# Patient Record
Sex: Female | Born: 1965
Health system: Southern US, Community
[De-identification: ages and names within clinical notes are randomized; demographics above are authoritative.]

## PROBLEM LIST (undated history)

## (undated) DIAGNOSIS — K219 Gastro-esophageal reflux disease without esophagitis: Secondary | ICD-10-CM

## (undated) DIAGNOSIS — J40 Bronchitis, not specified as acute or chronic: Secondary | ICD-10-CM

## (undated) DIAGNOSIS — M549 Dorsalgia, unspecified: Secondary | ICD-10-CM

## (undated) DIAGNOSIS — G709 Myoneural disorder, unspecified: Secondary | ICD-10-CM

## (undated) DIAGNOSIS — Z720 Tobacco use: Secondary | ICD-10-CM

## (undated) DIAGNOSIS — F32A Depression, unspecified: Secondary | ICD-10-CM

## (undated) DIAGNOSIS — F329 Major depressive disorder, single episode, unspecified: Secondary | ICD-10-CM

## (undated) DIAGNOSIS — F419 Anxiety disorder, unspecified: Secondary | ICD-10-CM

## (undated) HISTORY — DX: Myoneural disorder, unspecified: G70.9

## (undated) HISTORY — DX: Gastro-esophageal reflux disease without esophagitis: K21.9

## (undated) HISTORY — DX: Major depressive disorder, single episode, unspecified: F32.9

## (undated) HISTORY — DX: Dorsalgia, unspecified: M54.9

## (undated) HISTORY — DX: Depression, unspecified: F32.A

## (undated) HISTORY — DX: Tobacco use: Z72.0

## (undated) HISTORY — DX: Anxiety disorder, unspecified: F41.9

## (undated) HISTORY — DX: Bronchitis, not specified as acute or chronic: J40

---

## 1988-10-31 HISTORY — PX: TUBAL LIGATION: SHX77

## 2016-12-07 NOTE — Congregational Nurse Program (Signed)
Congregational Nurse Program Note  Date of Encounter: 12/06/2016  Past Medical History: No past medical history on file.  Encounter Details:     CNP Questionnaire - 12/06/16 1330      Patient Demographics   Is this a new or existing patient? New   Patient is considered a/an Not Applicable   Race Caucasian/White     Patient Assistance   Location of Patient Assistance [place]   Patient's financial/insurance status Low Income;Self-Pay (Uninsured)   Uninsured Patient ([place]ange Card/Care Connects) Yes   Interventions Not Applicable   Patient referred to apply for the following financial assistance Not Applicable   Food insecurities addressed Referred to food bank or resource   Transportation assistance No   Assistance securing medications No   Educational health offerings Other;Navigating the healthcare system     Encounter Details   Primary purpose of visit Education/Health Concerns;Other;Navigating the Healthcare System   Was an Emergency Department visit averted? Not Applicable   Does patient have a medical provider? No   Patient referred to Establish PCP;Private Practice;Other   Was a mental health screening completed? (GAINS tool) Yes   Was a mental health referral made? No   Does patient have dental issues? No   Does patient have vision issues? No   Does your patient have an abnormal blood pressure today? No   Since previous encounter, have you referred patient for abnormal blood pressure that resulted in a new diagnosis or medication change? No   Does your patient have an abnormal blood glucose today? No   Since previous encounter, have you referred patient for abnormal blood glucose that resulted in a new diagnosis or medication change? No   Was there a life-saving intervention made? No      [facility] who had called stating she needed a primary care provider for a physical so she could begin work at [employer].  Client arrived vital signs  taken and reviewed. Client presents paperwork that she has been evaluated by the [employer] [facility] [employer] employee nurse and due to an existing history of carpal tunnel without corrective surgery and prior back issues, she needs a medical provider to state she is cleared to do the type of job she would be hired for.  RN contacted the [facility] and they are unable to evaluate clients for fitness for work . Client states she has already contacted the Urgent Care and [place] and was told they could not help her. Client states she wants this complete so she can begin work Thursday 12/08/16 this week. Client states [place] moved from [place] here and needs to work. She states she has borrowed some money to pay if she can find someone to evaluate her. RN referred client to [facility] [employer] BSW intern for case management to call area providers for any possible providers that would be willing to evaluate her. One provider was found and contact information given to client to follow through.  RN also called [employer] health employee nurse for clarification on what was needed. Employee nurse states that with past history of back carpal tunnel issues and the nature of the work she would be doing, it is required that the person seeking employment find their own medical provider to issue a letter stating if she can or cannot be fit for the work stated and requirements stated in information given to client.  RN will follow up with client next week to see if she had gotten approval for work.

## 2016-12-12 ENCOUNTER — Encounter: Payer: Self-pay | Admitting: Family Medicine

## 2016-12-12 ENCOUNTER — Ambulatory Visit (INDEPENDENT_AMBULATORY_CARE_PROVIDER_SITE_OTHER): Payer: Self-pay | Admitting: Family Medicine

## 2016-12-12 VITALS — BP 102/56 | HR 76 | Temp 98.9°F | Resp 18 | Ht 65.0 in | Wt 143.0 lb

## 2016-12-12 DIAGNOSIS — Z720 Tobacco use: Secondary | ICD-10-CM

## 2016-12-12 DIAGNOSIS — Z716 Tobacco abuse counseling: Secondary | ICD-10-CM | POA: Insufficient documentation

## 2016-12-12 DIAGNOSIS — Z7689 Persons encountering health services in other specified circumstances: Secondary | ICD-10-CM

## 2016-12-12 DIAGNOSIS — B182 Chronic viral hepatitis C: Secondary | ICD-10-CM

## 2016-12-12 DIAGNOSIS — Z23 Encounter for immunization: Secondary | ICD-10-CM

## 2016-12-12 DIAGNOSIS — G5600 Carpal tunnel syndrome, unspecified upper limb: Secondary | ICD-10-CM | POA: Insufficient documentation

## 2016-12-12 DIAGNOSIS — G5603 Carpal tunnel syndrome, bilateral upper limbs: Secondary | ICD-10-CM

## 2016-12-12 HISTORY — DX: Tobacco use: Z72.0

## 2016-12-12 NOTE — Progress Notes (Signed)
Chief Complaint  Patient presents with  . Establish Care   Patient is new to establish. She would like a letter sent to a prospective employer regarding her ability to work safely. She went to apply for a job and was told she needed a letter from her PCP before they could hire her. She has bilateral carpal tunnel syndrome. She's had this for many years. At her prior job after working there for a short time she developed symptomatic carpal tunnel, and discontinued work. She was not successful in establishing a Worker's Comp. claim. She now wishes to work for a new company, and concern exists regarding her ability to use her upper extremities. She states she is currently asymptomatic. She has not had any carpal tunnel syndrome since leaving her prior repetitive job. She states that she is otherwise in good health. She has hepatitis C. She thinks it's from an old tattoo. She denies ever using IV drugs. She is a tobacco smoker.I have discussed the multiple health risks associated with cigarette smoking including, but not limited to, cardiovascular disease, lung disease and cancer.  I have strongly recommended that smoking be stopped.  I have reviewed the various methods of quitting including cold Mal[place]urkey, classes, nicotine replacements and prescription medications.  I have offered assistance in this difficult process.  The patient is not interested in assistance at this time.    Patient Active Problem List   Diagnosis Date Noted  . CTS (carpal tunnel syndrome) 12/12/2016  . Tobacco abuse 12/12/2016  . Hepatitis C 12/12/2016    No outpatient encounter prescriptions on file as of 12/12/2016.   No facility-administered encounter medications on file as of 12/12/2016.     Past Medical History:  Diagnosis Date  . Anxiety   . Depression   . Neuromuscular disorder (HCC)    bilat carpal tunnel synd  . Tobacco abuse 12/12/2016    Past Surgical History:  Procedure Laterality Date  . TUBAL  LIGATION  1990    Social History   Social History  . Marital status: Single    Spouse name: N/A  . Number of children: 1  . Years of education: 812   Occupational History  . Not on file.   Social History Main Topics  . Smoking status: Current Every Day Smoker    Packs/day: 0.50    Types: Cigarettes    Start date: 10/31/1980  . Smokeless tobacco: Never Used     Comment: discussed  . Alcohol use No  . Drug use: No  . Sexual activity: Not Currently   Other Topics Concern  . Not on file   Social History Narrative   Lives alone   Micah FlesherSon Aaron lives near       Family History  Problem Relation Age of Onset  . Heart disease Mother   . Hyperlipidemia Mother   . Hypertension Mother   . Stroke Mother   . Cancer Father     pancrease  . Diabetes Father   . Hearing loss Father   . Cancer Sister     Corky Mullovairan  . Hepatitis C Sister   . Drug abuse Sister   . Cancer Paternal Grandmother     pancreatic    Review of Systems  Constitutional: Negative for chills, fever and weight loss.  HENT: Negative for congestion and hearing loss.   Eyes: Negative for blurred vision and pain.  Respiratory: Negative for cough and shortness of breath.   Cardiovascular: Negative for chest pain and leg  swelling.  Gastrointestinal: Negative for abdominal pain, constipation, diarrhea and heartburn.  Genitourinary: Negative for dysuria and frequency.  Musculoskeletal: Negative for falls, joint pain and myalgias.  Neurological: Negative for dizziness, seizures and headaches.  Psychiatric/Behavioral: Negative for depression. The patient is not nervous/anxious and does not have insomnia.     BP (!) 102/56 (BP Location: Right Arm, Patient Position: Sitting, Cuff Size: Normal)   Pulse 76   Temp 98.9 F (37.2 C) (Temporal)   Resp 18   Ht 5\' 5"  (1.651 m)   Wt 143 lb (64.9 kg)   LMP 12/12/2010 (Approximate)   SpO2 99%   BMI 23.80 kg/m   Physical Exam  Constitutional: She is oriented to person,  place, and time. She appears well-developed and well-nourished.  Smells of tobacco  HENT:  Head: Normocephalic and atraumatic.  Right Ear: External ear normal.  Left Ear: External ear normal.  Mouth/Throat: Oropharynx is clear and moist.  Eyes: Conjunctivae are normal. Pupils are equal, round, and reactive to light.  Neck: Normal range of motion. Neck supple. No thyromegaly present.  Cardiovascular: Normal rate, regular rhythm and normal heart sounds.   Pulmonary/Chest: Effort normal and breath sounds normal. No respiratory distress.  Abdominal: Soft. Bowel sounds are normal.  Musculoskeletal: Normal range of motion. She exhibits no edema.  Phalen's and Tinel's testing is negative  Lymphadenopathy:    She has no cervical adenopathy.  Neurological: She is alert and oriented to person, place, and time.  Gait normal  Skin: Skin is warm and dry.  Psychiatric: She has a normal mood and affect. Her behavior is normal. Thought content normal.  Nursing note and vitals reviewed.  ASSESSMENT/PLAN:  1. Encounter to establish care with new doctor  2. Need for influenza vaccination - Flu Vaccine QUAD 36+ mos IM  3. Bilateral carpal tunnel syndrome I discussed with the patient that I will be happy to contact her company with a letter. I told her that I could clear her to do any job except for one that was deemed repetitive with upper extremities. I told her that with a job that required heavy or repetitive use of her hands she would likely recurrence of her carpal tunnel syndrome symptoms.  4. Tobacco abuse Advised to quit   Patient Instructions  Try to cut down or quit smoking  Need old records from PCP/GYN provider  I will contact your company to send them a note about working safely  Need yearly medical visits  Call sooner for problems     Eustace Moore, MD

## 2016-12-12 NOTE — Patient Instructions (Signed)
Try to cut down or quit smoking  Need old records from PCP/GYN provider  I will contact your company to send them a note about working safely  Need yearly medical visits  Call sooner for problems

## 2016-12-14 ENCOUNTER — Ambulatory Visit (INDEPENDENT_AMBULATORY_CARE_PROVIDER_SITE_OTHER): Payer: Self-pay | Admitting: Internal Medicine

## 2016-12-14 ENCOUNTER — Encounter: Payer: Self-pay | Admitting: Internal Medicine

## 2016-12-14 ENCOUNTER — Encounter: Payer: Self-pay | Admitting: Pharmacist

## 2016-12-14 VITALS — BP 102/69 | HR 92 | Temp 98.2°F | Wt 141.0 lb

## 2016-12-14 DIAGNOSIS — B182 Chronic viral hepatitis C: Secondary | ICD-10-CM

## 2016-12-14 MED ORDER — SOFOSBUVIR-VELPATASVIR 400-100 MG PO TABS: 1.0000 | ORAL_TABLET | Freq: Every day | ORAL | 2 refills | Status: DC

## 2016-12-14 NOTE — Patient Instructions (Signed)
Date 12/14/16  Dear Ms Whitney Powell, As discussed in the ID Clinic, your hepatitis C therapy will include the following medications:    sofosbuvir 400 mg/velpatasvir 100 mg (Epclusa) oral daily  Please note that ALL MEDICATIONS WILL START ON THE SAME DATE for a total of 12 weeks. ---------------------------------------------------------------- Your HCV Treatment Start Date: TBA   Your HCV genotype: 1a    Liver Fibrosis: no cirrhosis  ---------------------------------------------------------------- YOUR PHARMACY CONTACT (depending on your insurance):   [facility] [address] [place], [place] [address] Phone: [phone] Hours: Monday to Friday 7:30 am to 6:00 pm   Please always contact your pharmacy at least 3-4 business days before you run out of medications to ensure your next month's medication is ready or 1 week prior to running out if you receive it by mail.  Remember, each prescription is for 28 days. ---------------------------------------------------------------- GENERAL NOTES REGARDING YOUR HEPATITIS C MEDICATION:  SOFOSBUVIR (SOVALDI or EPCLUSA): - Sofosbuvir 400 mg tablet is taken daily with OR without food. - The sofosbuvir tablets are yellow. - The Epclusa tablets are pink, diamond-shaped - The tablets should be stored at room temperature. - The most common side effects with sofosbuvir or Epclusa include:      1. Fatigue      2. Headache      3. Nausea      4. Diarrhea      5. Insomnia  - Acid reducing agents such as H2 blockers (ie. Pepcid (famotidine), Zantac (ranitidine), Tagamet (cimetidine), Axid (nizatidine) and proton pump inhibitors (ie. Prilosec (omeprazole), Protonix (pantoprazole), Nexium (esomeprazole), or Aciphex (rabeprazole)) can decrease effectiveness of Harvoni. Do not take until you have discussed with a health care provider.    -Antacids that contain magnesium and/or aluminum hydroxide (ie. Milk of Magensia, Rolaids, Gaviscon,  Maalox, Mylanta, an dArthritis Pain Formula)can reduce absorption of sofosbuvir, so take them at least 4 hours before or after Harvoni.  -Calcium carbonate (calcium supplements or antacids such as Tums, Caltrate, Os-Cal)needs to be taken at least 4 hours hours before or after sofosbuvir.  -St. John's wort or any products that contain St. John's wort like some herbal supplements  Please inform the office prior to starting any of these medications.   Please note that this only lists the most common side effects and is NOT a comprehensive list of the potential side effects of these medications. For more information, please review the drug information sheets that come with your medication package from the pharmacy.  ---------------------------------------------------------------- GENERAL HELPFUL HINTS ON HCV THERAPY: 1. No alcohol. 2. Stay well-hydrated 3. Notify the ID Clinic of any changes in your other over-the-counter/herbal or prescription medications. 4. If you miss a dose of your medication, take the missed dose as soon as you remember. Return to your regular time/dose schedule the next day.  5.  Do not stop taking your medications without first talking with your healthcare provider. 6.  You may take Tylenol (acetaminophen), as long as the dose is less than 2000 mg (OR no more than 4 tablets of the Tylenol Extra Strengths 500mg  tablet) in 24 hours. 7. You will follow up with our clinic pharmacist initially after starting the medication to monitor for any possible side effects 8. You will get labs once during treatment, soon after treatment completion and again 6 months or more after treatment completion to verify the virus is completely gone.   Staci RighterOMER, Kimala Horne, MD  [facility] [address]  [place], [place]  [address] [phone]

## 2016-12-14 NOTE — Progress Notes (Signed)
[facility]   CC: consideration for treatment for chronic hepatitis C  HPI:  +Whitney Powell is a 51 y.o. female who presents for initial evaluation and management of chronic hepatitis C.  Patient tested positive last year in [place]. Hepatitis C-associated risk factors present are: history of tattoo 6 years ago.  Was previously tested negative. Patient denies intranasal drug use, IV drug abuse, sexual contact with person with liver disease. Patient has had other studies performed. Results: hepatitis C RNA by PCR, result: positive. Patient has not had prior treatment for Hepatitis C. Patient does not have a past history of liver disease. Patient does not have a family history of liver disease. Patient does not  have associated signs or symptoms related to liver disease.  Records from [place] including labs reviewed and confirm chronic hepatitis C with a positive viral load with recent Fibrosure F0/1 and ultrasound without cirrhosis.      Patient does not have documented immunity to Hepatitis A. Patient does not have documented immunity to Hepatitis B.    Review of Systems:   Constitutional: negative for fatigue and malaise Gastrointestinal: negative for diarrhea Integument/breast: negative for rash Musculoskeletal: negative for myalgias and arthralgias All other systems reviewed and are negative       Past Medical History:  Diagnosis Date  . Anxiety   . Depression   . Neuromuscular disorder (HCC)    bilat carpal tunnel synd  . Tobacco abuse 12/12/2016    Prior to Admission medications   Medication Sig Start Date End Date Taking? Authorizing Provider  Sofosbuvir-Velpatasvir (EPCLUSA) 400-100 MG TABS Take 1 tablet by mouth daily. 12/14/16   Gardiner Barefootobert W Comer, MD    No Known Allergies  Social History  Substance Use Topics  . Smoking status: Current Every Day Smoker    Packs/day: 0.50    Types: Cigarettes    Start date: 10/31/1980  . Smokeless tobacco: Never Used     Comment: discussed  . Alcohol use No    Family History  Problem Relation Age of Onset  . Heart disease Mother   . Hyperlipidemia Mother   . Hypertension Mother   . Stroke Mother   . Cancer Father     pancrease  . Diabetes Father   . Hearing loss Father   . Cancer Sister     Corky Mullovairan  . Hepatitis C Sister   . Drug abuse Sister   . Cancer Paternal Grandmother     pancreatic  cirrhosis in sister   Objective:  Constitutional: in no apparent distress,  Vitals:   12/14/16 0933  BP: 102/69  Pulse: 92  Temp: 98.2 F (36.8 C)   Eyes: anicteric Cardiovascular: Cor RRR Respiratory: CTA B; normal respiratory effort Gastrointestinal: Bowel sounds are normal, liver is not enlarged, spleen is not enlarged Musculoskeletal: no pedal edema noted Skin: negatives: no rash; no porphyria cutanea tarda Lymphatic: no cervical lymphadenopathy   Laboratory Genotype: No results found for: HCVGENOTYPE HCV viral load: No results found for: HCVQUANT No results found for: WBC, HGB, HCT, MCV, PLT No results found for: CREATININE, BUN, NA, K, CL, CO2 No results found for: ALT, AST, GGT, ALKPHOS   Labs and history reviewed and show CHILD-PUGH A  5-6 points: Child class A 7-9 points: Child class B 10-15 points: Child class C  No results found for: INR, BILITOT, ALBUMIN   Assessment: New Patient with Chronic Hepatitis C genotype 1a, untreated.  I discussed with the patient the lab  findings that confirm chronic hepatitis C as well as the natural history and progression of disease including about 30% of people who develop cirrhosis of the liver if left untreated and once cirrhosis is established there is a 2-7% risk per year of liver cancer and liver failure.  I discussed the importance of treatment and benefits in reducing the risk, even if significant liver fibrosis exists.   Plan: 1) Patient counseled extensively on limiting acetaminophen to no more than 2 grams daily, avoidance of  alcohol. 2) Transmission discussed with patient including sexual transmission, sharing razors and toothbrush.   3) Will need referral to gastroenterology if concern for cirrhosis 4) Will need referral for substance abuse counseling: No.; Further work up to include urine drug screen  No. 5) Will prescribe Epclusa for 12 weeks via [facility].   6) Hepatitis A vaccine No. 7) Hepatitis B vaccine No. 8) Pneumovax vaccine when she has coverage 9) No elastography indicated with low Fibrosure score and normal ultrasound 10) will follow up after starting medication  All labs from [place] and to be scanned in system  With no medical coverage, she requests limiting labs so will check a CMP at about 4 weeks after starting treatment then an HCV viral load after treatment and at 6 months after treatment completion (SVR 24). Will hold on vaccines until she has coverage

## 2016-12-14 NOTE — Progress Notes (Signed)
Hep C genotype and fibrosure labs scanned under media section.  Will order Hep C viral load today.

## 2016-12-21 LAB — HEPATITIS C RNA QUANTITATIVE
HCV Quantitative Log: 5.83 Log IU/mL — ABNORMAL HIGH
HCV Quantitative: 673000 IU/mL — ABNORMAL HIGH

## 2017-01-02 ENCOUNTER — Encounter: Payer: Self-pay | Admitting: Family Medicine

## 2017-01-02 DIAGNOSIS — F988 Other specified behavioral and emotional disorders with onset usually occurring in childhood and adolescence: Secondary | ICD-10-CM | POA: Insufficient documentation

## 2017-01-02 DIAGNOSIS — F329 Major depressive disorder, single episode, unspecified: Secondary | ICD-10-CM | POA: Insufficient documentation

## 2017-01-02 DIAGNOSIS — F1010 Alcohol abuse, uncomplicated: Secondary | ICD-10-CM | POA: Insufficient documentation

## 2017-01-02 DIAGNOSIS — F419 Anxiety disorder, unspecified: Secondary | ICD-10-CM

## 2017-01-23 ENCOUNTER — Ambulatory Visit: Payer: Self-pay

## 2017-01-24 ENCOUNTER — Ambulatory Visit (INDEPENDENT_AMBULATORY_CARE_PROVIDER_SITE_OTHER): Payer: Self-pay | Admitting: Pharmacist

## 2017-01-24 DIAGNOSIS — B182 Chronic viral hepatitis C: Secondary | ICD-10-CM

## 2017-01-24 NOTE — Progress Notes (Signed)
HPI: Whitney Powell is a 51 y.o. female who presents to the [facility] pharmacy clinic for follow-up of her Hep C infection. She has genotype 1a, F0/F1, and started Epclusa on 01/05/17.  No results found for: HCVGENOTYPE, HEPCGENOTYPE  Allergies: No Known Allergies  Past Medical History: Past Medical History:  Diagnosis Date  . Anxiety   . Depression   . Neuromuscular disorder (HCC)    bilat carpal tunnel synd  . Tobacco abuse 12/12/2016    Social History: Social History   Social History  . Marital status: Single    Spouse name: N/A  . Number of children: 1  . Years of education: 7012   Social History Main Topics  . Smoking status: Current Every Day Smoker    Packs/day: 0.50    Types: Cigarettes    Start date: 10/31/1980  . Smokeless tobacco: Never Used     Comment: discussed  . Alcohol use No  . Drug use: No  . Sexual activity: Not Currently   Other Topics Concern  . Not on file   Social History Narrative   Lives alone   Micah FlesherSon Aaron lives near       [facility]: No results found for: HIV1RNAQUANT, HIV1RNAVL, CD4TABS, HEPBSAB, HEPBSAG, HCVAB  No results found for: HCVGENOTYPE, HEPCGENOTYPE  Hepatitis C RNA quantitative Latest Ref Rng & Units 12/14/2016  HCV Quantitative NOT DETECTED IU/mL 673,000(H)  HCV Quantitative Log NOT DETECTED Log IU/mL 5.83(H)    No results found for: AST, ALT, INR  CrCl: CrCl cannot be calculated (No order found.).  Fibrosis Score: F0/F1 as assessed by ultrasound   Previous Treatment Regimen: None  Assessment: Whitney Powell is here today for Hep C follow-up.  She started [place]Epclusa almost a month ago on 01/05/17.  She states she has not missed a single dose. She did say she took one 2 hours late one day.  I told her it was important to take the medication around the same time each day to maintain consistent drug levels, but I was not worried about one day and a few hours late.  I would rather her take it sometime during the day than not take it at all.  I  stressed the importance of compliance to her. She has not had any issues tolerating Epclusa.  She did say her back pain has gotten worse and she thought it was due to IndiaEpclusa.  I told her it was doubtful that it was due to that. [place] and I told her I didn't think there was an ID clinic there but I would check.  We are doing limited labs due to lab costs and no insurance.  I will get a CMET today at Dr. Ephriam Knucklesomer's request and have her follow up with us at [facility].   Plans: - Continue Epclusa x 12 weeks - CMET today - F/u with me again at [facility] on 6/11 at 4pm  Cassie L. Kuppelweiser, PharmD, CPP Infectious Diseases Clinical Pharmacist [facility] 01/24/2017, 3:58 PM

## 2017-01-25 LAB — COMPREHENSIVE METABOLIC PANEL
ALBUMIN: 4 g/dL (ref 3.6–5.1)
ALT: 10 U/L (ref 6–29)
AST: 17 U/L (ref 10–35)
Alkaline Phosphatase: 56 U/L (ref 33–115)
BUN: 9 mg/dL (ref 7–25)
CALCIUM: 9.4 mg/dL (ref 8.6–10.2)
CHLORIDE: 102 mmol/L (ref 98–110)
CO2: 25 mmol/L (ref 20–31)
Creat: 0.69 mg/dL (ref 0.50–1.10)
Glucose, Bld: 85 mg/dL (ref 65–99)
POTASSIUM: 4.1 mmol/L (ref 3.5–5.3)
Sodium: 136 mmol/L (ref 135–146)
TOTAL PROTEIN: 7 g/dL (ref 6.1–8.1)
Total Bilirubin: 0.3 mg/dL (ref 0.2–1.2)

## 2017-04-10 ENCOUNTER — Ambulatory Visit (INDEPENDENT_AMBULATORY_CARE_PROVIDER_SITE_OTHER): Payer: Self-pay | Admitting: Pharmacist

## 2017-04-10 DIAGNOSIS — B182 Chronic viral hepatitis C: Secondary | ICD-10-CM

## 2017-04-10 NOTE — Progress Notes (Signed)
HPI: Minus LibertyDarla Wormley is a 51 y.o. female who presents to the [facility] pharmacy clinic for EOT Hep C follow-up.  She has genotype 1a, F0/F1 fibrosis, and just completed 12 weeks of Epclusa.   No results found for: HCVGENOTYPE, HEPCGENOTYPE  Allergies: No Known Allergies  Past Medical History: Past Medical History:  Diagnosis Date  . Anxiety   . Depression   . Neuromuscular disorder (HCC)    bilat carpal tunnel synd  . Tobacco abuse 12/12/2016    Social History: Social History   Social History  . Marital status: Single    Spouse name: N/A  . Number of children: 1  . Years of education: 3012   Social History Main Topics  . Smoking status: Current Every Day Smoker    Packs/day: 0.50    Types: Cigarettes    Start date: 10/31/1980  . Smokeless tobacco: Never Used     Comment: discussed  . Alcohol use No  . Drug use: No  . Sexual activity: Not Currently   Other Topics Concern  . Not on file   Social History Narrative   Lives alone   Micah FlesherSon Aaron lives near       [place] [place]ony[facility]abs: No results found for: HIV1RNAQUANT, HIV1RNAVL, CD4TABS, HEPBSAB, HEPBSAG, HCVAB  No results found for: HCVGENOTYPE, HEPCGENOTYPE  Hepatitis C RNA quantitative Latest Ref Rng & Units 12/14/2016  HCV Quantitative NOT DETECTED IU/mL 673,000(H)  HCV Quantitative Log NOT DETECTED Log IU/mL 5.83(H)    AST (U/L)  Date Value  01/24/2017 17   ALT (U/L)  Date Value  01/24/2017 10    CrCl: CrCl cannot be calculated (Patient's most recent lab result is older than the maximum 21 days allowed.).  Fibrosis Score: F0/F1 as assessed by fibrosure  Child-Pugh Score: A  Previous Treatment Regimen: None  Assessment: Earl is here today for Hep C follow-up.  She has completed the full 12 weeks of Epclusa.  She did not miss any doses, did take it late twice.  No side effects. We did not get a viral load at the last visit because she has no insurance coverage.  We will get one today.  Will have her follow-up with us in 6  months for her cure visit per Dr. Luciana Axeomer.  She is requesting labs and cure visit in one visit due to driving from [place].    Plans: - [facility] Hep C VL today - F/u with pharmacy for cure visit 12/13 at 4pm  Shonda Mandarino L. Xerxes Agrusa, PharmD, CPP Infectious Diseases Clinical Pharmacist [facility] 04/10/2017, 4:08 PM

## 2017-04-12 LAB — HEPATITIS C RNA QUANTITATIVE
HCV QUANT LOG: NOT DETECTED {Log_IU}/mL
HCV Quantitative: <15 IU/mL

## 2017-07-21 ENCOUNTER — Encounter: Payer: Self-pay | Admitting: Family Medicine

## 2017-07-21 ENCOUNTER — Ambulatory Visit (INDEPENDENT_AMBULATORY_CARE_PROVIDER_SITE_OTHER): Payer: Self-pay | Admitting: Family Medicine

## 2017-07-21 VITALS — BP 92/58 | HR 68 | Temp 97.8°F | Resp 18 | Ht 65.0 in | Wt 141.0 lb

## 2017-07-21 DIAGNOSIS — F419 Anxiety disorder, unspecified: Secondary | ICD-10-CM

## 2017-07-21 DIAGNOSIS — F329 Major depressive disorder, single episode, unspecified: Secondary | ICD-10-CM

## 2017-07-21 DIAGNOSIS — H6982 Other specified disorders of Eustachian tube, left ear: Secondary | ICD-10-CM

## 2017-07-21 MED ORDER — FLUOXETINE HCL 20 MG PO TABS: 20.0000 mg | ORAL_TABLET | Freq: Every day | ORAL | 11 refills | Status: DC

## 2017-07-21 NOTE — Progress Notes (Signed)
Chief Complaint  Patient presents with  . Otalgia    left   Hep C treated with good result - no detectable virus L ear pain off an on for weeks No cold or sinus symptoms Depression - needs to go back on med Not drinking Is still smoking Is working at [employer]  Patient Active Problem List   Diagnosis Date Noted  . Anxiety and depression 01/02/2017  . Alcohol abuse 01/02/2017  . Attention deficit disorder (ADD) 01/02/2017  . CTS (carpal tunnel syndrome) 12/12/2016  . Tobacco abuse 12/12/2016  . Chronic hepatitis C without hepatic coma (HCC) 12/12/2016    Outpatient Encounter Prescriptions as of 07/21/2017  Medication Sig  . FLUoxetine (PROZAC) 20 MG tablet Take 1 tablet (20 mg total) by mouth daily.  . [DISCONTINUED] Sofosbuvir-Velpatasvir (EPCLUSA) 400-100 MG TABS Take 1 tablet by mouth daily. (Patient not taking: Reported on 07/21/2017)   No facility-administered encounter medications on file as of 07/21/2017.     No Known Allergies  Review of Systems  Constitutional: Negative for activity change, appetite change, fever and unexpected weight change.  HENT: Positive for ear pain. Negative for congestion, dental problem, hearing loss, postnasal drip, rhinorrhea, sinus pain, sore throat and tinnitus.   Eyes: Negative for photophobia and visual disturbance.  Respiratory: Negative for cough and shortness of breath.   Cardiovascular: Negative for chest pain and palpitations.  Gastrointestinal: Negative for constipation and diarrhea.  Neurological: Negative for dizziness and headaches.  Psychiatric/Behavioral: Positive for decreased concentration and dysphoric mood. Negative for self-injury, sleep disturbance and suicidal ideas. The patient is not nervous/anxious.     BP (!) 92/58 (BP Location: Right Arm, Patient Position: Sitting, Cuff Size: Normal)   Pulse 68   Temp 97.8 F (36.6 C) (Temporal)   Resp 18   Ht  (1.651 m)   Wt 141 lb 0.6 oz (64 kg)   LMP  12/12/2010 (Approximate)   SpO2 99%   BMI 23.47 kg/m   Physical Exam  Constitutional: She is oriented to person, place, and time. She appears well-developed and well-nourished. No distress.  HENT:  Head: Normocephalic and atraumatic.  Right Ear: External ear normal.  Left Ear: External ear normal.  Nose: Nose normal.  Mouth/Throat: Oropharynx is clear and moist.  Mild hypertrophy tonsils, mild injection L eardrum  Eyes: Pupils are equal, round, and reactive to light. Conjunctivae are normal.  Neck: Neck supple.  Cardiovascular: Normal rate and regular rhythm.   Pulmonary/Chest: Effort normal and breath sounds normal. No respiratory distress. She has no wheezes.  Neurological: She is alert and oriented to person, place, and time.  Psychiatric: Her behavior is normal. Thought content normal.  Tearful, articulate    ASSESSMENT/PLAN:  1. Eustachian tube dysfunction, left Cetirizine or flonase OTC 2. Depression - discussed SSRI meds , use, side effects, expected results  Patient Instructions  Eustachian Tube Dysfunction The eustachian tube connects the middle ear to the back of the nose. It regulates air pressure in the middle ear by allowing air to move between the ear and nose. It also helps to drain fluid from the middle ear space. When the eustachian tube does not function properly, air pressure, fluid, or both can build up in the middle ear. Eustachian tube dysfunction can affect one or both ears. What are the causes? This condition happens when the eustachian tube becomes blocked or cannot open normally. This may result from:  Ear infections.  Colds and other upper respiratory infections.  Allergies.  Irritation, such as from cigarette smoke or acid from the stomach coming up into the esophagus (gastroesophageal reflux).  Sudden changes in air pressure, such as from descending in an airplane.  Abnormal growths in the nose or throat, such as nasal polyps, tumors, or  enlarged tissue at the back of the throat (adenoids).  What increases the risk? This condition may be more likely to develop in people who smoke and people who are overweight. Eustachian tube dysfunction may also be more likely to develop in children, especially children who have:  Certain birth defects of the mouth, such as cleft palate.  Large tonsils and adenoids.  What are the signs or symptoms? Symptoms of this condition may include:  A feeling of fullness in the ear.  Ear pain.  Clicking or popping noises in the ear.  Ringing in the ear.  Hearing loss.  Loss of balance.  Symptoms may get worse when the air pressure around you changes, such as when you travel to an area of high elevation or fly on an airplane. How is this diagnosed? This condition may be diagnosed based on:  Your symptoms.  A physical exam of your ear, nose, and throat.  Tests, such as those that measure: ? The movement of your eardrum (tympanogram). ? Your hearing (audiometry).  How is this treated? Treatment depends on the cause and severity of your condition. If your symptoms are mild, you may be able to relieve your symptoms by moving air into ("popping") your ears. If you have symptoms of fluid in your ears, treatment may include:  Decongestants.  Antihistamines.  Nasal sprays or ear drops that contain medicines that reduce swelling (steroids).  In some cases, you may need to have a procedure to drain the fluid in your eardrum (myringotomy). In this procedure, a small tube is placed in the eardrum to:  Drain the fluid.  Restore the air in the middle ear space.  Follow these instructions at home:  Take over-the-counter and prescription medicines only as told by your health care provider.  Use techniques to help pop your ears as recommended by your health care provider. These may include: ? Chewing gum. ? Yawning. ? Frequent, forceful swallowing. ? Closing your mouth, holding your  nose closed, and gently blowing as if you are trying to blow air out of your nose.  Do not do any of the following until your health care provider approves: ? Travel to high altitudes. ? Fly in airplanes. ? Work in a Estate agent or room. ? Scuba dive.  Keep your ears dry. Dry your ears completely after showering or bathing.  Do not smoke.  Keep all follow-up visits as told by your health care provider. This is important. Contact a health care provider if:  Your symptoms do not go away after treatment.  Your symptoms come back after treatment.  You are unable to pop your ears.  You have: ? A fever. ? Pain in your ear. ? Pain in your head or neck. ? Fluid draining from your ear.  Your hearing suddenly changes.  You become very dizzy.  You lose your balance. This information is not intended to replace advice given to you by your health care provider. Make sure you discuss any questions you have with your health care provider. Document Released: 11/13/2015 Document Revised: 03/24/2016 Document Reviewed: 11/05/2014 Elsevier Interactive Patient Education  2018 Elsevier Inc.    Eustace Moore, MD

## 2017-07-21 NOTE — Patient Instructions (Addendum)
Eustachian Tube Dysfunction The eustachian tube connects the middle ear to the back of the nose. It regulates air pressure in the middle ear by allowing air to move between the ear and nose. It also helps to drain fluid from the middle ear space. When the eustachian tube does not function properly, air pressure, fluid, or both can build up in the middle ear. Eustachian tube dysfunction can affect one or both ears. What are the causes? This condition happens when the eustachian tube becomes blocked or cannot open normally. This may result from:  Ear infections.  Colds and other upper respiratory infections.  Allergies.  Irritation, such as from cigarette smoke or acid from the stomach coming up into the esophagus (gastroesophageal reflux).  Sudden changes in air pressure, such as from descending in an airplane.  Abnormal growths in the nose or throat, such as nasal polyps, tumors, or enlarged tissue at the back of the throat (adenoids).  What increases the risk? This condition may be more likely to develop in people who smoke and people who are overweight. Eustachian tube dysfunction may also be more likely to develop in children, especially children who have:  Certain birth defects of the mouth, such as cleft palate.  Large tonsils and adenoids.  What are the signs or symptoms? Symptoms of this condition may include:  A feeling of fullness in the ear.  Ear pain.  Clicking or popping noises in the ear.  Ringing in the ear.  Hearing loss.  Loss of balance.  Symptoms may get worse when the air pressure around you changes, such as when you travel to an area of high elevation or fly on an airplane. How is this diagnosed? This condition may be diagnosed based on:  Your symptoms.  A physical exam of your ear, nose, and throat.  Tests, such as those that measure: ? The movement of your eardrum (tympanogram). ? Your hearing (audiometry).  How is this treated? Treatment  depends on the cause and severity of your condition. If your symptoms are mild, you may be able to relieve your symptoms by moving air into ("popping") your ears. If you have symptoms of fluid in your ears, treatment may include:  Decongestants.  Antihistamines.  Nasal sprays or ear drops that contain medicines that reduce swelling (steroids).  In some cases, you may need to have a procedure to drain the fluid in your eardrum (myringotomy). In this procedure, a small tube is placed in the eardrum to:  Drain the fluid.  Restore the air in the middle ear space.  Follow these instructions at home:  Take over-the-counter and prescription medicines only as told by your health care provider.  Use techniques to help pop your ears as recommended by your health care provider. These may include: ? Chewing gum. ? Yawning. ? Frequent, forceful swallowing. ? Closing your mouth, holding your nose closed, and gently blowing as if you are trying to blow air out of your nose.  Do not do any of the following until your health care provider approves: ? Travel to high altitudes. ? Fly in airplanes. ? Work in a pressurized cabin or room. ? Scuba dive.  Keep your ears dry. Dry your ears completely after showering or bathing.  Do not smoke.  Keep all follow-up visits as told by your health care provider. This is important. Contact a health care provider if:  Your symptoms do not go away after treatment.  Your symptoms come back after treatment.  You are   unable to pop your ears.  You have: ? A fever. ? Pain in your ear. ? Pain in your head or neck. ? Fluid draining from your ear.  Your hearing suddenly changes.  You become very dizzy.  You lose your balance. This information is not intended to replace advice given to you by your health care provider. Make sure you discuss any questions you have with your health care provider. Document Released: 11/13/2015 Document Revised: 03/24/2016  Document Reviewed: 11/05/2014 Elsevier Interactive Patient Education  2018 Elsevier Inc.  

## 2017-07-25 ENCOUNTER — Other Ambulatory Visit: Payer: Self-pay

## 2017-07-25 MED ORDER — FLUOXETINE HCL 20 MG PO TABS: 20.0000 mg | ORAL_TABLET | Freq: Every day | ORAL | 1 refills | Status: DC

## 2017-07-27 ENCOUNTER — Telehealth: Payer: Self-pay | Admitting: Family Medicine

## 2017-07-27 NOTE — Telephone Encounter (Signed)
Patient says Dr. Delton See prescribed her some depression medication that was on a $4 list, when she got to pharmacy it was over $200 (she has no insurance) She also wants to know what the name of the medication is that she said she could take for her ear ache (she listed off Flonase or Claritin) Cb#: [phone]

## 2017-07-28 NOTE — Telephone Encounter (Signed)
It was a simple error- tablet vs capsule.  We have sent new order. Either med will help with ear, flonase better than claritin

## 2017-07-31 NOTE — Telephone Encounter (Signed)
Called patient, she picked up-bad signal? Will call back later.

## 2017-08-01 NOTE — Telephone Encounter (Signed)
Called patient regarding message below. No answer, unable to leave message.  

## 2017-08-04 ENCOUNTER — Telehealth: Payer: Self-pay

## 2017-08-04 NOTE — Telephone Encounter (Signed)
Please call patient Monday about ear pain.  I told her Dr Delton See was out of the office.

## 2017-08-04 NOTE — Telephone Encounter (Signed)
Error

## 2017-08-08 NOTE — Telephone Encounter (Signed)
Left message to call office

## 2017-09-07 ENCOUNTER — Telehealth: Payer: Self-pay | Admitting: *Deleted

## 2017-09-07 MED ORDER — FLUOXETINE HCL 20 MG PO TABS: 20.0000 mg | ORAL_TABLET | Freq: Every day | ORAL | 1 refills | Status: DC

## 2017-09-07 NOTE — Telephone Encounter (Signed)
Patient left message stating she needs her medications refilled to [facility] she is no longer uses [facility]. Patient requested we send all medications to [facility]. [phone]

## 2017-09-07 NOTE — Telephone Encounter (Signed)
Seen 07/21/17

## 2017-10-12 ENCOUNTER — Ambulatory Visit: Payer: Medicaid Other

## 2017-11-30 ENCOUNTER — Ambulatory Visit (INDEPENDENT_AMBULATORY_CARE_PROVIDER_SITE_OTHER): Payer: Self-pay | Admitting: Family Medicine

## 2017-11-30 ENCOUNTER — Other Ambulatory Visit: Payer: Self-pay

## 2017-11-30 ENCOUNTER — Encounter: Payer: Self-pay | Admitting: Family Medicine

## 2017-11-30 VITALS — BP 110/62 | HR 65 | Temp 98.8°F | Resp 16 | Ht 65.0 in | Wt 143.0 lb

## 2017-11-30 DIAGNOSIS — R440 Auditory hallucinations: Secondary | ICD-10-CM | POA: Insufficient documentation

## 2017-11-30 DIAGNOSIS — F329 Major depressive disorder, single episode, unspecified: Secondary | ICD-10-CM

## 2017-11-30 DIAGNOSIS — Z0000 Encounter for general adult medical examination without abnormal findings: Secondary | ICD-10-CM

## 2017-11-30 DIAGNOSIS — F419 Anxiety disorder, unspecified: Secondary | ICD-10-CM

## 2017-11-30 MED ORDER — FLUOXETINE HCL (PMDD) 20 MG PO CAPS: 20.0000 mg | ORAL_CAPSULE | Freq: Every day | ORAL | 3 refills | Status: DC

## 2017-11-30 NOTE — Patient Instructions (Signed)
Need to see specialist for depression  Need mammogram in august this year  Try to reduce or quit smoking  I have sent a new Rx to [facility] for depression Take once a day  See me in one month

## 2017-11-30 NOTE — Progress Notes (Signed)
Chief Complaint  Patient presents with  . Annual Exam   Patient is here for an annual examination. She is currently not taking any medication. She states she cannot afford the fluoxetine. She states she does not have any insurance. Her depression screening score is very high. I discussed with her that she needs to take medicine for depression.  She confides in me that it addition to depression, she hears voices.  She thinks she might have schizophrenia.  She is never told this to her doctor before.  The voices do not have any specific words or demands, more like murmuring.  I explained to her that this is beyond my capability as a family physician.  She agrees to a referral to psychiatry. Otherwise is eating well.  She feels that she gets enough exercise.  She works in [employer]. She is overdue for dental care.  States she cannot afford She continues to smoke cigarettes.  The importance of smoking cessation is again discussed with her. She is not drinking any alcohol at this time. She has been treated for her hepatitis C and her most recent viral load was not detected  Patient Active Problem List   Diagnosis Date Noted  . Auditory hallucination 11/30/2017  . Anxiety and depression 01/02/2017  . Alcohol abuse 01/02/2017  . Attention deficit disorder (ADD) 01/02/2017  . CTS (carpal tunnel syndrome) 12/12/2016  . Tobacco abuse 12/12/2016  . Chronic hepatitis C without hepatic coma (HCC) 12/12/2016    Outpatient Encounter Medications as of 11/30/2017  Medication Sig  . FLUoxetine (PROZAC) 20 MG tablet Take 1 tablet (20 mg total) daily by mouth. (Patient not taking: Reported on 11/30/2017)  . Fluoxetine HCl, PMDD, 20 MG CAPS Take 1 capsule (20 mg total) by mouth daily.   No facility-administered encounter medications on file as of 11/30/2017.     No Known Allergies  Review of Systems  Constitutional: Negative for activity change, appetite change and unexpected weight change.    HENT: Negative for congestion, dental problem, postnasal drip and rhinorrhea.   Eyes: Negative for redness and visual disturbance.  Respiratory: Negative for cough and shortness of breath.   Cardiovascular: Negative for chest pain, palpitations and leg swelling.  Gastrointestinal: Negative for abdominal pain, constipation and diarrhea.  Genitourinary: Negative for difficulty urinating, frequency and menstrual problem.  Musculoskeletal: Negative for arthralgias and back pain.  Neurological: Negative for dizziness and headaches.  Psychiatric/Behavioral: Positive for decreased concentration and hallucinations. Negative for self-injury, sleep disturbance and suicidal ideas. The patient is nervous/anxious.     BP 110/62 (BP Location: Left Arm, Patient Position: Sitting, Cuff Size: Normal)   Pulse 65   Temp 98.8 F (37.1 C) (Temporal)   Resp 16   Ht 5\' 5"  (1.651 m)   Wt 143 lb (64.9 kg)   LMP 12/12/2010 (Approximate)   SpO2 98%   BMI 23.80 kg/m   Physical Exam   BP 110/62 (BP Location: Left Arm, Patient Position: Sitting, Cuff Size: Normal)   Pulse 65   Temp 98.8 F (37.1 C) (Temporal)   Resp 16   Ht 5\' 5"  (1.651 m)   Wt 143 lb (64.9 kg)   LMP 12/12/2010 (Approximate)   SpO2 98%   BMI 23.80 kg/m   General Appearance:    Alert, cooperative, no distress, appears stated age.  Appears depressed mood and affect.  Slightly anxious and tearful when discussing her depression  Head:    Normocephalic, without obvious abnormality, atraumatic  Eyes:  PERRL, conjunctiva/corneas clear, EOM's intact, fundi    benign, both eyes  Ears:    Normal TM's and external ear canals, both ears  Nose:   Nares normal, septum midline, mucosa normal, no drainage    or sinus tenderness  Throat:   Lips, mucosa, and tongue normal; teeth are in poor repair, gums normal  Neck:   Supple, symmetrical, trachea midline, no adenopathy;    thyroid:  no enlargement/tenderness/nodules; no carotid   bruit  Back:      Symmetric, no curvature, ROM normal, no CVA tenderness  Lungs:     Clear to auscultation bilaterally, respirations unlabored  Chest Wall:    No tenderness or deformity   Heart:    Regular rate and rhythm, S1 and S2 normal, no murmur, rub   or gallop  Breast Exam:    No tenderness, masses, or nipple abnormality  Abdomen:     Soft, non-tender, bowel sounds active all four quadrants,    no masses, no organomegaly  Extremities:   Extremities normal, atraumatic, no cyanosis or edema  Pulses:   2+ and symmetric all extremities  Skin:   Skin color, texture, turgor normal, no rashes or lesions  Lymph nodes:   Cervical, supraclavicular, and axillary nodes normal  Neurologic:   CNII-XII intact, normal strength, sensation and reflexes    throughout     ASSESSMENT/PLAN:  1. Anxiety and depression  - Ambulatory referral to Psychiatry  2. Auditory hallucination  - Ambulatory referral to Psychiatry  3. Annual physical exam No unexpected finding.   Patient Instructions  Need to see specialist for depression  Need mammogram in august this year  Try to reduce or quit smoking  I have sent a [facility] Rx to [facility] for depression Take once a day  See me in one month   Eustace Moore, MD

## 2017-12-28 ENCOUNTER — Ambulatory Visit: Payer: Medicaid Other | Admitting: Family Medicine

## 2018-01-08 ENCOUNTER — Encounter: Payer: Self-pay | Admitting: Family Medicine

## 2018-10-02 ENCOUNTER — Emergency Department (HOSPITAL_COMMUNITY)
Admission: EM | Admit: 2018-10-02 | Discharge: 2018-10-02 | Disposition: A | Discharge status: Home / Self Care | Payer: Medicaid Other | Attending: Emergency Medicine | Admitting: Emergency Medicine

## 2018-10-02 ENCOUNTER — Encounter (HOSPITAL_COMMUNITY): Payer: Self-pay | Admitting: Emergency Medicine

## 2018-10-02 ENCOUNTER — Other Ambulatory Visit: Payer: Self-pay

## 2018-10-02 DIAGNOSIS — F17210 Nicotine dependence, cigarettes, uncomplicated: Secondary | ICD-10-CM | POA: Insufficient documentation

## 2018-10-02 DIAGNOSIS — Z79899 Other long term (current) drug therapy: Secondary | ICD-10-CM | POA: Insufficient documentation

## 2018-10-02 DIAGNOSIS — H9202 Otalgia, left ear: Secondary | ICD-10-CM | POA: Insufficient documentation

## 2018-10-02 MED ORDER — HYDROCODONE-ACETAMINOPHEN 5-325 MG PO TABS: 1.0000 | ORAL_TABLET | ORAL | 0 refills | Status: DC | PRN

## 2018-10-02 NOTE — ED Triage Notes (Signed)
PT c/o left ear pain x1 week unrelieved by nasal spray, and sweet oils.

## 2018-10-02 NOTE — Discharge Instructions (Addendum)
You may take the hydrocodone prescribed for pain relief.  This will make you drowsy - do not drive within 4 hours of taking this medication.  You may also try a liquid ear drop medicine called antipyrine/benzocaine - no prescription needed. This is a topical numbing agent safe to place in your ear canal.

## 2018-10-02 NOTE — ED Provider Notes (Signed)
[facility]ERGENCY [facility]PARTMENT Provider Note   CSN: [id number] Arrival date & time: 10/02/18  1347     History   Chief Complaint Chief Complaint  Patient presents with  . Otalgia    HPI Whitney Powell is a 52 y.o. female presenting with left ear pain.  She describes intermittent episodes of pain in the same ear which typically will last 1 to 2 days and then resolves and has been an ongoing symptom for the past 2 to 3 years.  She has been seen by her prior PCP for this, Dr. Delton SeeNelson and this was felt to be eustachian tube dysfunction.  The patient has taken antihistamines in conjunction with Flonase nasal spray with no significant improvement in her pain.  She denies fevers or chills, there was no drainage from the ear.  She does endorse being in her car with her son who was playing his music very loudly and after this event her pain started.  She denies any nasal or sinus complaints, no dental pain or problems.  In fact, states she had a dental extraction over a year ago in the hopes that these pain episodes would resolve which it has not.  She denies decreased hearing acuity.  She has instilled sweet oil without relief.  The history is provided by the patient.    Past Medical History:  Diagnosis Date  . Anxiety   . Depression   . Neuromuscular disorder (HCC)    bilat carpal tunnel synd  . Tobacco abuse 12/12/2016    Patient Active Problem List   Diagnosis Date Noted  . Auditory hallucination 11/30/2017  . Anxiety and depression 01/02/2017  . Alcohol abuse 01/02/2017  . Attention deficit disorder (ADD) 01/02/2017  . CTS (carpal tunnel syndrome) 12/12/2016  . Tobacco abuse 12/12/2016  . Chronic hepatitis C without hepatic coma (HCC) 12/12/2016    Past Surgical History:  Procedure Laterality Date  . TUBAL LIGATION  1990     OB History    Gravida      Para      Term      Preterm      AB      Living  1     SAB      TAB      Ectopic      Multiple      Live  Births               Home Medications    Prior to Admission medications   Medication Sig Start Date End Date Taking? Authorizing Provider  FLUoxetine (PROZAC) 20 MG tablet Take 1 tablet (20 mg total) daily by mouth. Patient not taking: Reported on 11/30/2017 09/07/17   Eustace MooreNelson, Yvonne Sue, MD  Fluoxetine HCl, PMDD, 20 MG CAPS Take 1 capsule (20 mg total) by mouth daily. 11/30/17   Eustace MooreNelson, Yvonne Sue, MD  HYDROcodone-acetaminophen (NORCO/VICODIN) 5-325 MG tablet Take 1 tablet by mouth every 4 (four) hours as needed. 10/02/18   Burgess AmorIdol, Martez Weiand, PA-C    Family History Family History  Problem Relation Age of Onset  . Heart disease Mother   . Hyperlipidemia Mother   . Hypertension Mother   . Stroke Mother   . Cancer Father        pancrease  . Diabetes Father   . Hearing loss Father   . Cancer Sister        Corky Mullovairan  . Hepatitis C Sister   . Drug abuse Sister   . Cancer Paternal Grandmother  pancreatic    Social History Social History   Tobacco Use  . Smoking status: Current Every Day Smoker    Packs/day: 0.50    Types: Cigarettes    Start date: 10/31/1980  . Smokeless tobacco: Never Used  . Tobacco comment: discussed  Substance Use Topics  . Alcohol use: No  . Drug use: No     Allergies   Patient has no known allergies.   Review of Systems Review of Systems  Constitutional: Negative for chills and fever.  HENT: Positive for ear pain. Negative for congestion, ear discharge, rhinorrhea, sinus pressure, sore throat, trouble swallowing and voice change.   Eyes: Negative for discharge.  Respiratory: Negative for cough, shortness of breath, wheezing and stridor.   Cardiovascular: Negative for chest pain.  Gastrointestinal: Negative for abdominal pain.  Genitourinary: Negative.   Neurological: Negative for dizziness and headaches.     Physical Exam Updated Vital Signs BP 104/67 (BP Location: Right Arm)   Pulse 62   Temp 97.8 F (36.6 C) (Oral)   Resp 16   Ht  5\' 5"  (1.651 m)   Wt 74.8 kg   LMP 12/12/2010 (Approximate)   SpO2 97%   BMI 27.46 kg/m   Physical Exam  Constitutional: She is oriented to person, place, and time. She appears well-developed and well-nourished.  HENT:  Head: Normocephalic and atraumatic.  Right Ear: Tympanic membrane and ear canal normal.  Left Ear: Ear canal normal. No drainage. No foreign bodies. No mastoid tenderness. Tympanic membrane is not injected, not erythematous, not retracted and not bulging.  No middle ear effusion.  Nose: Mucosal edema and rhinorrhea present.  Mouth/Throat: Uvula is midline, oropharynx is clear and moist and mucous membranes are normal. No oropharyngeal exudate, posterior oropharyngeal edema, posterior oropharyngeal erythema or tonsillar abscesses.  Uniformly opaque left TM, no loss of landmarks. There is mild rough texture to the TM, not pearly shiny. No obvious effusion. External canal clear.   Eyes: Conjunctivae are normal.  Cardiovascular: Normal rate.  Pulmonary/Chest: Effort normal. No respiratory distress.  Musculoskeletal: Normal range of motion.  Neurological: She is alert and oriented to person, place, and time.  Skin: Skin is warm and dry. No rash noted.  Psychiatric: She has a normal mood and affect.  Nursing note and vitals reviewed.    ED Treatments / Results  Labs (all labs ordered are listed, but only abnormal results are displayed) Labs Reviewed - No data to display  EKG None  Radiology No results found.  Procedures Procedures (including critical care time)  Medications Ordered in ED Medications - No data to display   Initial Impression / Assessment and Plan / ED Course  I have reviewed the triage vital signs and the nursing notes.  Pertinent labs & imaging results that were available during my care of the patient were reviewed by me and considered in my medical decision making (see chart for details).     PT with left ear pain and loss of TM  translucence.  No purulence.  Pt with chronic intermittent ear pain.  Advised she needs further eval by ENT, referral give.  She was prescribed hydrocodone tabs, caution re sedation.  She cannot take nsaids due to interaction with prozac.   [place] controlled substance database reviewed.   Final Clinical Impressions(s) / ED Diagnoses   Final diagnoses:  Otalgia of left ear    ED Discharge Orders         Ordered    HYDROcodone-acetaminophen (NORCO/VICODIN) 5-325 MG  tablet  Every 4 hours PRN     10/02/18 1524           Burgess Amor, PA-C 10/02/18 1702    Samuel Jester, DO 10/06/18 [phone]

## 2018-10-02 NOTE — ED Notes (Signed)
Patient given discharge instruction, verbalized understand. Patient ambulatory out of the department.  

## 2018-11-01 ENCOUNTER — Ambulatory Visit (INDEPENDENT_AMBULATORY_CARE_PROVIDER_SITE_OTHER): Payer: Self-pay | Admitting: Otolaryngology

## 2018-11-13 ENCOUNTER — Emergency Department (HOSPITAL_COMMUNITY): Payer: Self-pay

## 2018-11-13 ENCOUNTER — Encounter (HOSPITAL_COMMUNITY): Payer: Self-pay

## 2018-11-13 ENCOUNTER — Emergency Department (HOSPITAL_COMMUNITY)
Admission: EM | Admit: 2018-11-13 | Discharge: 2018-11-13 | Disposition: A | Discharge status: Home / Self Care | Payer: Self-pay | Attending: Emergency Medicine | Admitting: Emergency Medicine

## 2018-11-13 ENCOUNTER — Other Ambulatory Visit: Payer: Self-pay

## 2018-11-13 DIAGNOSIS — F17210 Nicotine dependence, cigarettes, uncomplicated: Secondary | ICD-10-CM | POA: Insufficient documentation

## 2018-11-13 DIAGNOSIS — M5432 Sciatica, left side: Secondary | ICD-10-CM | POA: Insufficient documentation

## 2018-11-13 DIAGNOSIS — F909 Attention-deficit hyperactivity disorder, unspecified type: Secondary | ICD-10-CM | POA: Insufficient documentation

## 2018-11-13 DIAGNOSIS — F329 Major depressive disorder, single episode, unspecified: Secondary | ICD-10-CM | POA: Insufficient documentation

## 2018-11-13 DIAGNOSIS — F419 Anxiety disorder, unspecified: Secondary | ICD-10-CM | POA: Insufficient documentation

## 2018-11-13 DIAGNOSIS — Z79899 Other long term (current) drug therapy: Secondary | ICD-10-CM | POA: Insufficient documentation

## 2018-11-13 DIAGNOSIS — F1010 Alcohol abuse, uncomplicated: Secondary | ICD-10-CM | POA: Insufficient documentation

## 2018-11-13 MED ORDER — CYCLOBENZAPRINE HCL 10 MG PO TABS: 10.0000 mg | ORAL_TABLET | Freq: Three times a day (TID) | ORAL | 0 refills | Status: DC | PRN

## 2018-11-13 MED ORDER — PREDNISONE 10 MG PO TABS: ORAL_TABLET | ORAL | 0 refills | Status: DC

## 2018-11-13 NOTE — Discharge Instructions (Signed)
Alternate ice and heat to your lower back.  Take the medication as directed.  Follow-up with your primary care provider or with the clinic listed if needed.

## 2018-11-13 NOTE — ED Provider Notes (Signed)
[facility] Provider Note   CSN: [id number] Arrival date & time: 11/13/18  1126     History   Chief Complaint Chief Complaint  Patient presents with  . Back Pain    HPI Whitney Powell is a 53 y.o. female.  HPI   Whitney Powell is a 53 y.o. female who presents to the Emergency Department complaining of worsening of her chronic low back pain.  She reports a history of sciatica for several years, and has been worsening for the last month.  She states that she does a lot of walking and standing at her job and feels that this may be exacerbating her back pain.  She describes a sharp pain to her left low back that radiates into her left leg at times.  It extends down the backside of her left leg into her calf.  Pain does improve with certain positions.  She denies abdominal pain, fever, chills, urine or bowel changes, numbness or weakness of her lower extremities.  She has tried over-the-counter Aleve with minimal relief.  She states that she also has some pain medication left over from a previous prescription that she has been taking that does seem to help her pain.  No known injury.   Past Medical History:  Diagnosis Date  . Anxiety   . Depression   . Neuromuscular disorder (HCC)    bilat carpal tunnel synd  . Tobacco abuse 12/12/2016    Patient Active Problem List   Diagnosis Date Noted  . Auditory hallucination 11/30/2017  . Anxiety and depression 01/02/2017  . Alcohol abuse 01/02/2017  . Attention deficit disorder (ADD) 01/02/2017  . CTS (carpal tunnel syndrome) 12/12/2016  . Tobacco abuse 12/12/2016  . Chronic hepatitis C without hepatic coma (HCC) 12/12/2016    Past Surgical History:  Procedure Laterality Date  . TUBAL LIGATION  1990     OB History    Gravida      Para      Term      Preterm      AB      Living  1     SAB      TAB      Ectopic      Multiple      Live Births               Home Medications    Prior to  Admission medications   Medication Sig Start Date End Date Taking? Authorizing Provider  FLUoxetine (PROZAC) 20 MG tablet Take 1 tablet (20 mg total) daily by mouth. Patient not taking: Reported on 11/30/2017 09/07/17   Eustace MooreNelson, Yvonne Sue, MD  Fluoxetine HCl, PMDD, 20 MG CAPS Take 1 capsule (20 mg total) by mouth daily. 11/30/17   Eustace MooreNelson, Yvonne Sue, MD  HYDROcodone-acetaminophen (NORCO/VICODIN) 5-325 MG tablet Take 1 tablet by mouth every 4 (four) hours as needed. 10/02/18   Burgess AmorIdol, Julie, PA-C    Family History Family History  Problem Relation Age of Onset  . Heart disease Mother   . Hyperlipidemia Mother   . Hypertension Mother   . Stroke Mother   . Cancer Father        pancrease  . Diabetes Father   . Hearing loss Father   . Cancer Sister        Corky Mullovairan  . Hepatitis C Sister   . Drug abuse Sister   . Cancer Paternal Grandmother        pancreatic    Social History  Social History   Tobacco Use  . Smoking status: Current Every Day Smoker    Packs/day: 0.50    Types: Cigarettes    Start date: 10/31/1980  . Smokeless tobacco: Never Used  . Tobacco comment: discussed  Substance Use Topics  . Alcohol use: No  . Drug use: No     Allergies   Patient has no known allergies.   Review of Systems Review of Systems  Constitutional: Negative for fever.  Respiratory: Negative for shortness of breath.   Gastrointestinal: Negative for abdominal pain, constipation and vomiting.  Genitourinary: Negative for decreased urine volume, difficulty urinating, dysuria, flank pain and hematuria.  Musculoskeletal: Positive for back pain. Negative for joint swelling.  Skin: Negative for rash.  Neurological: Negative for weakness and numbness.     Physical Exam Updated Vital Signs BP (!) 108/52 (BP Location: Right Arm)   Pulse 76   Temp 98 F (36.7 C) (Oral)   Resp 18   Ht 5\' 5"  (1.651 m)   LMP 12/12/2010 (Approximate)   SpO2 100%   BMI 27.46 kg/m   Physical Exam Vitals signs and  nursing note reviewed.  Constitutional:      General: She is not in acute distress.    Appearance: Normal appearance. She is well-developed.  HENT:     Head: Normocephalic and atraumatic.  Neck:     Musculoskeletal: Normal range of motion and neck supple.  Cardiovascular:     Rate and Rhythm: Normal rate and regular rhythm.     Comments: DP pulses are strong and palpable bilaterally Pulmonary:     Effort: Pulmonary effort is normal. No respiratory distress.     Breath sounds: Normal breath sounds.  Abdominal:     General: There is no distension.     Palpations: Abdomen is soft.     Tenderness: There is no abdominal tenderness.  Musculoskeletal:        General: Tenderness present.     Lumbar back: She exhibits tenderness and pain. She exhibits normal range of motion, no swelling, no deformity, no laceration and normal pulse.     Comments: ttp of the left lower lumbar spine and left lumbar paraspinal muscles.  Pain reproduced on straight leg raise on the left.  Pt has 5/5 strength against resistance of bilateral lower extremities.  Hip flexors and extensors are intact   Skin:    General: Skin is warm and dry.     Capillary Refill: Capillary refill takes less than 2 seconds.     Findings: No rash.  Neurological:     Mental Status: She is alert and oriented to person, place, and time.     Sensory: No sensory deficit.     Motor: No abnormal muscle tone.     Coordination: Coordination normal.     Gait: Gait normal.     Deep Tendon Reflexes:     Reflex Scores:      Patellar reflexes are 2+ on the right side and 2+ on the left side.      Achilles reflexes are 2+ on the right side and 2+ on the left side.     ED Treatments / Results  Labs (all labs ordered are listed, but only abnormal results are displayed) Labs Reviewed - No data to display  EKG None  Radiology Dg Lumbar Spine Complete  Result Date: 11/13/2018 CLINICAL DATA:  Low back pain, no known injury EXAM: LUMBAR  SPINE - COMPLETE 4+ VIEW COMPARISON:  None. FINDINGS: Five lumbar-type  vertebral bodies. Normal lumbar lordosis.  No evidence of fracture or dislocation. Mild degenerative changes the lower lumbar spine. Visualized bony pelvis appears intact. IMPRESSION: Mild degenerative changes of the lower lumbar spine. Electronically Signed   By: Charline BillsSriyesh  Krishnan M.D.   On: 11/13/2018 12:44    Procedures Procedures (including critical care time)  Medications Ordered in ED Medications - No data to display   Initial Impression / Assessment and Plan / ED Course  I have reviewed the triage vital signs and the nursing notes.  Pertinent labs & imaging results that were available during my care of the patient were reviewed by me and considered in my medical decision making (see chart for details).     Patient is ambulatory with a steady gait.  No focal neuro deficits on exam.  No concerning symptoms for emergent neurological process.  This is likely acute on chronic sciatica.  No concerning symptoms for infectious process.  Patient agrees to treatment plan with steroid taper and muscle relaxers.  She will continue her current pain medication as directed.  No hx of recent trauma to suggest need for imaging.  Return precautions were discussed.  Final Clinical Impressions(s) / ED Diagnoses   Final diagnoses:  Sciatica of left side    ED Discharge Orders    None       Pauline Ausriplett, Devani Odonnel, PA-C 11/13/18 1403    Pricilla LovelessGoldston, Scott, MD 11/13/18 1614

## 2018-11-13 NOTE — ED Triage Notes (Signed)
Pt is having lower back pain that started a month ago. Years ago she had an injury and states it has gotten worse as she gets older. NAD. Ambulatory.

## 2018-12-17 ENCOUNTER — Other Ambulatory Visit: Payer: Self-pay

## 2018-12-17 ENCOUNTER — Emergency Department (HOSPITAL_COMMUNITY)
Admission: EM | Admit: 2018-12-17 | Discharge: 2018-12-17 | Disposition: A | Discharge status: Home / Self Care | Payer: Medicaid Other | Attending: Emergency Medicine | Admitting: Emergency Medicine

## 2018-12-17 ENCOUNTER — Encounter (HOSPITAL_COMMUNITY): Payer: Self-pay | Admitting: Emergency Medicine

## 2018-12-17 DIAGNOSIS — Z79899 Other long term (current) drug therapy: Secondary | ICD-10-CM | POA: Insufficient documentation

## 2018-12-17 DIAGNOSIS — F17210 Nicotine dependence, cigarettes, uncomplicated: Secondary | ICD-10-CM | POA: Insufficient documentation

## 2018-12-17 DIAGNOSIS — M5442 Lumbago with sciatica, left side: Secondary | ICD-10-CM | POA: Insufficient documentation

## 2018-12-17 DIAGNOSIS — M5441 Lumbago with sciatica, right side: Secondary | ICD-10-CM | POA: Insufficient documentation

## 2018-12-17 MED ORDER — PREDNISONE 20 MG PO TABS: ORAL_TABLET | ORAL | 0 refills | Status: DC

## 2018-12-17 MED ORDER — DEXAMETHASONE SODIUM PHOSPHATE 10 MG/ML IJ SOLN
10.0000 mg | Freq: Once | INTRAMUSCULAR | Status: AC
  Administered 2018-12-17: 10 mg via INTRAMUSCULAR
  Filled 2018-12-17: qty 1

## 2018-12-17 MED ORDER — CYCLOBENZAPRINE HCL 5 MG PO TABS: 5.0000 mg | ORAL_TABLET | Freq: Three times a day (TID) | ORAL | 0 refills | Status: DC | PRN

## 2018-12-17 MED ORDER — KETOROLAC TROMETHAMINE 30 MG/ML IJ SOLN
30.0000 mg | Freq: Once | INTRAMUSCULAR | Status: AC
  Administered 2018-12-17: 30 mg via INTRAMUSCULAR
  Filled 2018-12-17: qty 1

## 2018-12-17 NOTE — Discharge Instructions (Addendum)
Use ice and heat for comfort.  Take the medications as prescribed.  Once the prednisone taper is down to 20 mg a day you can start taking ibuprofen 400 mg 4 times a day.  You need to get a primary care doctor, you can try the health department or the [facility].  I also gave you the name of the local orthopedist in town and two orthopedists in [place] that specializes in back problems.

## 2018-12-17 NOTE — ED Provider Notes (Signed)
[facility] Provider Note   CSN: [id number] Arrival date & time: 12/17/18  0011  Time seen 1:00 AM   History   Chief Complaint Chief Complaint  Patient presents with  . Back Pain    HPI Whitney Powell is a 53 y.o. female.  HPI patient states she had some back pain for 5 years ago when she was living in [place].  She was told it was going to be a chronic thing and she should limit her activities such as raking leaves.  She states she started having low back pain again about a month ago.  She states she has numbness going down into both legs to her the level of her knees.  She states the pain is constant and is sharp.  She denies any urinary or fecal incontinence, fever.  She states that her work she does a lot of walking and lifting and twisting.  Although she did not relate to me after her exam she does admit her pain is more on the left side.  Patient drove herself to the ED.  PCP Patient, No Pcp Per   Past Medical History:  Diagnosis Date  . Anxiety   . Depression   . Neuromuscular disorder (HCC)    bilat carpal tunnel synd  . Tobacco abuse 12/12/2016    Patient Active Problem List   Diagnosis Date Noted  . Auditory hallucination 11/30/2017  . Anxiety and depression 01/02/2017  . Alcohol abuse 01/02/2017  . Attention deficit disorder (ADD) 01/02/2017  . CTS (carpal tunnel syndrome) 12/12/2016  . Tobacco abuse 12/12/2016  . Chronic hepatitis C without hepatic coma (HCC) 12/12/2016    Past Surgical History:  Procedure Laterality Date  . TUBAL LIGATION  1990     OB History    Gravida      Para      Term      Preterm      AB      Living  1     SAB      TAB      Ectopic      Multiple      Live Births               Home Medications    Prior to Admission medications   Medication Sig Start Date End Date Taking? Authorizing Provider  cyclobenzaprine (FLEXERIL) 5 MG tablet Take 1 tablet (5 mg total) by mouth 3 (three) times  daily as needed. 12/17/18   Devoria AlbeKnapp, Fountain Derusha, MD  FLUoxetine (PROZAC) 20 MG tablet Take 1 tablet (20 mg total) daily by mouth. Patient not taking: Reported on 11/30/2017 09/07/17   Eustace MooreNelson, Yvonne Sue, MD  Fluoxetine HCl, PMDD, 20 MG CAPS Take 1 capsule (20 mg total) by mouth daily. 11/30/17   Eustace MooreNelson, Yvonne Sue, MD  HYDROcodone-acetaminophen (NORCO/VICODIN) 5-325 MG tablet Take 1 tablet by mouth every 4 (four) hours as needed. 10/02/18   Idol, Raynelle FanningJulie, PA-C  predniSONE (DELTASONE) 20 MG tablet Take 3 po QD x 3d , then 2 po QD x 3d then 1 po QD x 3d 12/17/18   Devoria AlbeKnapp, Militza Devery, MD    Family History Family History  Problem Relation Age of Onset  . Heart disease Mother   . Hyperlipidemia Mother   . Hypertension Mother   . Stroke Mother   . Cancer Father        pancrease  . Diabetes Father   . Hearing loss Father   . Cancer Sister  ovairan  . Hepatitis C Sister   . Drug abuse Sister   . Cancer Paternal Grandmother        pancreatic    Social History Social History   Tobacco Use  . Smoking status: Current Every Day Smoker    Packs/day: 0.50    Types: Cigarettes    Start date: 10/31/1980  . Smokeless tobacco: Never Used  . Tobacco comment: discussed  Substance Use Topics  . Alcohol use: No  . Drug use: No  employed   Allergies   Patient has no known allergies.   Review of Systems Review of Systems  All other systems reviewed and are negative.    Physical Exam Updated Vital Signs BP 102/66 (BP Location: Right Arm)   Pulse 84   Temp 97.8 F (36.6 C) (Oral)   Resp 16   LMP 12/12/2010 (Approximate)   SpO2 98%   Physical Exam Vitals signs and nursing note reviewed.  Constitutional:      Appearance: Normal appearance.  HENT:     Head: Normocephalic and atraumatic.     Right Ear: External ear normal.     Left Ear: External ear normal.     Nose: Nose normal.     Mouth/Throat:     Mouth: Mucous membranes are moist.  Eyes:     Conjunctiva/sclera: Conjunctivae normal.      Pupils: Pupils are equal, round, and reactive to light.  Neck:     Musculoskeletal: Normal range of motion.  Cardiovascular:     Rate and Rhythm: Normal rate.  Pulmonary:     Effort: Pulmonary effort is normal. No respiratory distress.  Musculoskeletal: Normal range of motion.        General: No deformity.     Comments: Patient is nontender in her thoracic and upper lumbar spine but she is tender diffusely in her mid to lower lumbar spine.  She is also tender over the left paraspinous muscles.  On range of motion of the lumbar spine she has no pain when she moves to the right and actually states forward flexion makes it feel better.  However she does state leaning to the left makes it hurt more.  She has negative straight leg raising bilaterally.  Her patellar reflexes are 2+ and equal.  Skin:    General: Skin is warm and dry.     Findings: No erythema.  Neurological:     General: No focal deficit present.     Mental Status: She is alert and oriented to person, place, and time.     Cranial Nerves: No cranial nerve deficit.  Psychiatric:        Mood and Affect: Mood normal.        Behavior: Behavior normal.        Thought Content: Thought content normal.      ED Treatments / Results  Labs (all labs ordered are listed, but only abnormal results are displayed) Labs Reviewed - No data to display  EKG None  Radiology No results found.   CLINICAL DATA:  Low back pain, no known injury  EXAM: LUMBAR SPINE - COMPLETE 4+ VIEW  COMPARISON:  None.  FINDINGS: Five lumbar-type vertebral bodies.  Normal lumbar lordosis.  No evidence of fracture or dislocation.  Mild degenerative changes the lower lumbar spine.  Visualized bony pelvis appears intact.  IMPRESSION: Mild degenerative changes of the lower lumbar spine.   Electronically Signed   By: Charline Bills M.D.   On: 11/13/2018 12:44  Procedures Procedures (including critical care time)  Medications  Ordered in ED Medications  dexamethasone (DECADRON) injection 10 mg (has no administration in time range)  ketorolac (TORADOL) 30 MG/ML injection 30 mg (has no administration in time range)     Initial Impression / Assessment and Plan / ED Course  I have reviewed the triage vital signs and the nursing notes.  Pertinent labs & imaging results that were available during my care of the patient were reviewed by me and considered in my medical decision making (see chart for details).     Patient was on Prozac prescribed by Dr. Delton See who patient does not see anymore because she left her practice.  She is  requesting me to write it and I have informed her that it is not a prescription we would right routinely prescribe from the ED, she needs to get a primary care doctor to keep her on it and monitor her on it.  She is also asking for me to fill out FMLA forms for work and again I had to tell her she needs to have a primary care doctor do that for her.  Patient drove herself to the ED.  She was given Toradol and Decadron IM.  She had x-rays done last month.  She has no red flags such as fever or incontinence.  She will be referred to orthopedics.  Final Clinical Impressions(s) / ED Diagnoses   Final diagnoses:  Acute left-sided low back pain with bilateral sciatica    ED Discharge Orders         Ordered    predniSONE (DELTASONE) 20 MG tablet     12/17/18 0134    cyclobenzaprine (FLEXERIL) 5 MG tablet  3 times daily PRN     12/17/18 0134          Plan discharge  Devoria Albe, MD, Concha Pyo, MD 12/17/18 [phone]

## 2018-12-17 NOTE — ED Triage Notes (Signed)
Pt C/O bilateral lower back pain that began "a couple months ago." Pt states she has hx of sciatica.

## 2019-01-07 ENCOUNTER — Telehealth: Payer: Self-pay | Admitting: Physician Assistant

## 2019-01-07 DIAGNOSIS — Z139 Encounter for screening, unspecified: Secondary | ICD-10-CM

## 2019-01-07 LAB — GLUCOSE, POCT (MANUAL RESULT ENTRY): POC GLUCOSE: 97 mg/dL (ref 70–99)

## 2019-01-07 NOTE — Congregational Nurse Program (Signed)
  Dept: [phone]   Congregational Nurse Program Note  Date of Encounter: 01/07/2019  Past Medical History: Past Medical History:  Diagnosis Date  . Anxiety   . Depression   . Neuromuscular disorder (HCC)    bilat carpal tunnel synd  . Tobacco abuse 12/12/2016    Encounter Details:  Pt presents to [facility] for screening referral for PCP; She states she was needing a PCP so she could obtain her meds for her sciatica primarily ;  States she also has a history of anxiety and depression with taking med since 2019.   States she barely can work due to her sciatica condiiton and inability to stand for long periods of time.   States she would like to obtain her meds, but currently unable to due to not being able to afford them because of her financial barriers.  Vitals signs checked and (WNL); Random Blood Glucose checked and (WNL)  Vitals signs reviewed with RN Nurse Case Manager, Jolaine Artist as it relates to pt to receive follow up medical case management post visit on today  Pt was enrolled in [facility] program on today with Ethlyn Daniels, financial counselor.  [facility]t [facility] with [facility] for  3/17//20 @ 8:30am

## 2019-01-15 ENCOUNTER — Other Ambulatory Visit: Payer: Self-pay

## 2019-01-15 ENCOUNTER — Encounter: Payer: Self-pay | Admitting: Physician Assistant

## 2019-01-15 ENCOUNTER — Other Ambulatory Visit: Payer: Self-pay | Admitting: Physician Assistant

## 2019-01-15 ENCOUNTER — Ambulatory Visit: Payer: Medicaid Other | Admitting: Physician Assistant

## 2019-01-15 VITALS — BP 90/60 | HR 79 | Temp 97.7°F

## 2019-01-15 DIAGNOSIS — F17200 Nicotine dependence, unspecified, uncomplicated: Secondary | ICD-10-CM

## 2019-01-15 DIAGNOSIS — Z7689 Persons encountering health services in other specified circumstances: Secondary | ICD-10-CM

## 2019-01-15 DIAGNOSIS — Z13220 Encounter for screening for lipoid disorders: Secondary | ICD-10-CM

## 2019-01-15 DIAGNOSIS — Z1239 Encounter for other screening for malignant neoplasm of breast: Secondary | ICD-10-CM

## 2019-01-15 DIAGNOSIS — F39 Unspecified mood [affective] disorder: Secondary | ICD-10-CM

## 2019-01-15 DIAGNOSIS — M5440 Lumbago with sciatica, unspecified side: Secondary | ICD-10-CM

## 2019-01-15 MED ORDER — CYCLOBENZAPRINE HCL 5 MG PO TABS: 5.0000 mg | ORAL_TABLET | Freq: Three times a day (TID) | ORAL | 0 refills | Status: DC | PRN

## 2019-01-15 MED ORDER — PREDNISONE 10 MG PO TABS: ORAL_TABLET | ORAL | 0 refills | Status: DC

## 2019-01-15 NOTE — Progress Notes (Signed)
BP 90/60   Pulse 79   Temp 97.7 F (36.5 C)   LMP 12/12/2010 (Approximate)   SpO2 99%    Subjective:    Patient ID: Whitney Powell, female    DOB: [birth year], 53 y.o.   MRN: [id number]  HPI: Whitney Powell is a 53 y.o. female presenting on 01/15/2019 for New Patient (Initial Visit)   HPI   Chief Complaint  Patient presents with  . New Patient (Initial Visit)    Pt was Previously treated by dr Delton See.  Note from 11/30/17 reviewed.   Last PAP and mammogram 2017  ER visit 11/13/18 for LBP.  Xray mild djd.  Pt Went back to ER 2/17 with back pain.  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    Pt says she is still having anxiety and depression and sometimes hears voices.  No SI or HI    Relevant past medical, surgical, family and social history reviewed and updated as indicated. Interim medical history since our last visit reviewed. Allergies and medications reviewed and updated.   Current Outpatient Medications:  .  cyclobenzaprine (FLEXERIL) 5 MG tablet, Take 1 tablet (5 mg total) by mouth 3 (three) times daily as needed., Disp: 30 tablet, Rfl: 0 .  naproxen sodium (ALEVE) 220 MG tablet, Take 220 mg by mouth daily as needed., Disp: , Rfl:   Review of Systems  Constitutional: Positive for fatigue. Negative for appetite change, chills, diaphoresis, fever and unexpected weight change.  HENT: Positive for ear pain. Negative for congestion, dental problem, drooling, facial swelling, hearing loss, mouth sores, sneezing, sore throat, trouble swallowing and voice change.   Eyes: Negative for pain, discharge, redness, itching and visual disturbance.  Respiratory: Negative for cough, choking, shortness of breath and wheezing.   Cardiovascular: Negative for chest pain, palpitations and leg swelling.  Gastrointestinal: Negative for abdominal pain, blood  in stool, constipation, diarrhea and vomiting.  Endocrine: Negative for cold intolerance, heat intolerance and polydipsia.  Genitourinary: Negative for decreased urine volume, dysuria and hematuria.  Musculoskeletal: Negative for arthralgias, back pain and gait problem.  Skin: Negative for rash.  Allergic/Immunologic: Negative for environmental allergies.  Neurological: Negative for seizures, syncope, light-headedness and headaches.  Hematological: Negative for adenopathy.  Psychiatric/Behavioral: Positive for dysphoric mood. Negative for agitation and suicidal ideas. The patient is nervous/anxious.     Per HPI unless specifically indicated above     Objective:    BP 90/60   Pulse 79   Temp 97.7 F (36.5 C)   LMP 12/12/2010 (Approximate)   SpO2 99%   Wt Readings from Last 3 Encounters:  01/07/19 138 lb 3.2 oz (62.7 kg)  10/02/18 165 lb (74.8 kg)  11/30/17 143 lb (64.9 kg)    Physical Exam Vitals signs reviewed.  Constitutional:      Appearance: She is well-developed.  HENT:     Head: Normocephalic and atraumatic.     Mouth/Throat:     Pharynx: No oropharyngeal exudate.  Eyes:     Conjunctiva/sclera: Conjunctivae normal.     Pupils: Pupils are equal, round, and reactive to light.  Neck:     Musculoskeletal: Neck supple.     Thyroid: No thyromegaly.  Cardiovascular:     Rate and Rhythm: Normal rate and regular rhythm.  Pulmonary:     Effort: Pulmonary effort is normal.  Breath sounds: Normal breath sounds.  Abdominal:     General: Bowel sounds are normal.     Palpations: Abdomen is soft. There is no mass.     Tenderness: There is no abdominal tenderness.  Musculoskeletal:     Lumbar back: She exhibits pain. She exhibits normal range of motion, no tenderness, no bony tenderness and no swelling.     Right lower leg: No edema.     Left lower leg: No edema.  Lymphadenopathy:     Cervical: No cervical adenopathy.  Skin:    General: Skin is warm and dry.   Neurological:     Mental Status: She is alert and oriented to person, place, and time.     Gait: Gait normal.  Psychiatric:        Speech: Speech normal.        Behavior: Behavior normal.         Assessment & Plan:    Encounter Diagnoses  Name Primary?  . Encounter to establish care Yes  . Screening cholesterol level   . Screening for breast cancer   . Tobacco use disorder   . Mood disorder (HCC)   . Low back pain with sciatica, sciatica laterality unspecified, unspecified back pain laterality, unspecified chronicity     -pt to go To [facility] MH issues  -will order Screening mammogram  -pt was given [facility] application  -will Screen lipids  -will Refer to ortho for back pain  -Discussed prednisone and MH issues with pt- pt prefers course of steroids due to pain.  rx flexeril also given with instructions to avoid driving on this due to sedation   -counseled smoking cessation  -pt will follow up in 3 months for PAP.  RTO sooenr prn

## 2019-01-28 ENCOUNTER — Telehealth (INDEPENDENT_AMBULATORY_CARE_PROVIDER_SITE_OTHER): Payer: Self-pay | Admitting: *Deleted

## 2019-01-28 NOTE — Telephone Encounter (Signed)
Unable to contact pt to reschedule appt, lmtrc to either move up the appt to a earlier time tomorrow or cancel the appt and reschedule at another time.

## 2019-01-29 ENCOUNTER — Ambulatory Visit (INDEPENDENT_AMBULATORY_CARE_PROVIDER_SITE_OTHER): Payer: Self-pay | Admitting: Orthopaedic Surgery

## 2019-01-30 ENCOUNTER — Telehealth (INDEPENDENT_AMBULATORY_CARE_PROVIDER_SITE_OTHER): Payer: Self-pay | Admitting: Radiology

## 2019-01-30 NOTE — Telephone Encounter (Signed)
I left message requesting return call to confirm patient appointment 01/31/2019 at 0915 and to go over COVID-19 screening questions.

## 2019-01-31 ENCOUNTER — Ambulatory Visit (INDEPENDENT_AMBULATORY_CARE_PROVIDER_SITE_OTHER): Payer: Self-pay | Admitting: Orthopaedic Surgery

## 2019-01-31 ENCOUNTER — Other Ambulatory Visit: Payer: Self-pay

## 2019-01-31 ENCOUNTER — Encounter (INDEPENDENT_AMBULATORY_CARE_PROVIDER_SITE_OTHER): Payer: Self-pay | Admitting: Orthopaedic Surgery

## 2019-01-31 VITALS — Ht 65.0 in | Wt 160.0 lb

## 2019-01-31 DIAGNOSIS — M545 Low back pain, unspecified: Secondary | ICD-10-CM

## 2019-01-31 NOTE — Progress Notes (Signed)
Office Visit Note   Patient: Whitney Powell           Date of Birth: 11-21-1965           MRN: [id number] Visit Date: 01/31/2019              Requested by: Jacquelin Hawking, PA-C [address] [place], [place] [address] PCP: Jacquelin Hawking, PA-C   Assessment & Plan: Visit Diagnoses:  1. Low back pain without sciatica, unspecified back pain laterality, unspecified chronicity     Plan: Low back pain mild degenerative changes with some facet degenerative changes L4-5 L5-S1.  She is looking into her insurance options we discussed neck step would be an MRI scan since she has been on medication, prednisone Dosepak, anti-inflammatories, muscle relaxant.  She will call when she like to have this scheduled.  Work slip given that she was seen today.  Follow-Up Instructions: No follow-ups on file.   Orders:  No orders of the defined types were placed in this encounter.  No orders of the defined types were placed in this encounter.     Procedures: No procedures performed   Clinical Data: No additional findings.   Subjective: Chief Complaint  Patient presents with  . Lower Back - Pain     HPI- 53 year old female seen with low back pain bilateral leg pain radiating to her leg she had problems with this about 5 years ago when she lived in [place].  She is been working at [employer] she does a lot of walking at work.  She been treated with prednisone pack x3 starting in January which helped some.  Flexeril IM injection.  She is 1/2 pack/day smoker chronically.  Plain radiographs demonstrated some degenerative disc no lytic sclerotic changes and no listhesis noted.  She denies associated bowel or bladder symptoms.   Review of Systems positive for acid reflux anxiety asthma bladder problems bronchitis depression history hepatitis C.  Half pack per day smoker.  Otherwise -14 point systems.  Previous tubal ligation about 1999.   Objective: Vital Signs: Ht 5\' 5"  (1.651 m)   Wt  160 lb (72.6 kg)   LMP 12/12/2010 (Approximate)   BMI 26.63 kg/m   Physical Exam Constitutional:      Appearance: She is well-developed.  HENT:     Head: Normocephalic.     Right Ear: External ear normal.     Left Ear: External ear normal.  Eyes:     Pupils: Pupils are equal, round, and reactive to light.  Neck:     Thyroid: No thyromegaly.     Trachea: No tracheal deviation.  Cardiovascular:     Rate and Rhythm: Normal rate.  Pulmonary:     Effort: Pulmonary effort is normal.  Abdominal:     Palpations: Abdomen is soft.  Skin:    General: Skin is warm and dry.  Neurological:     Mental Status: She is alert and oriented to person, place, and time.  Psychiatric:        Behavior: Behavior normal.     Ortho Exam patient is able heel and toe walk.  Negative straight leg raising 90 degrees.  Knee and ankle jerk are intact anterior tib gastrocsoleus is strong.  No rash over exposed skin  Specialty Comments:  No specialty comments available.  Imaging: No results found.   PMFS History: Patient Active Problem List   Diagnosis Date Noted  . Auditory hallucination 11/30/2017  . Anxiety and depression 01/02/2017  .  Alcohol abuse 01/02/2017  . Attention deficit disorder (ADD) 01/02/2017  . CTS (carpal tunnel syndrome) 12/12/2016  . Tobacco abuse 12/12/2016  . Chronic hepatitis C without hepatic coma (HCC) 12/12/2016   Past Medical History:  Diagnosis Date  . Anxiety   . Bronchitis   . Depression   . GERD (gastroesophageal reflux disease)   . Neuromuscular disorder (HCC)    bilat carpal tunnel synd  . Tobacco abuse 12/12/2016    Family History  Problem Relation Age of Onset  . Heart disease Mother   . Hyperlipidemia Mother   . Hypertension Mother   . Stroke Mother   . Cancer Father        pancrease  . Diabetes Father   . Hearing loss Father   . Cancer Sister        Corky Mull  . Hepatitis C Sister   . Cancer Paternal Grandmother        pancreatic cancer     Past Surgical History:  Procedure Laterality Date  . TUBAL LIGATION  1990   Social History   Occupational History  . Not on file  Tobacco Use  . Smoking status: Current Every Day Smoker    Packs/day: 0.50    Years: 46.00    Pack years: 23.00    Types: Cigarettes    Start date: 10/31/1980  . Smokeless tobacco: Never Used  . Tobacco comment: discussed  Substance and Sexual Activity  . Alcohol use: Yes    Alcohol/week: 1.0 standard drinks    Types: 1 Cans of beer per week    Comment: occassional  . Drug use: No  . Sexual activity: Not Currently

## 2019-03-26 ENCOUNTER — Telehealth: Payer: Self-pay | Admitting: Orthopaedic Surgery

## 2019-03-26 NOTE — Telephone Encounter (Signed)
She could [facility]. ucall thanks

## 2019-03-26 NOTE — Telephone Encounter (Signed)
Pt called saying she is about out of her meds, and needs to know if she needs to make a follow up apt with Dr. Ophelia Charter or go to the [facility]

## 2019-03-26 NOTE — Telephone Encounter (Signed)
Please advise. Would you like for me to schedule follow up for patient?

## 2019-03-26 NOTE — Telephone Encounter (Signed)
I left voicemail for patient advising. 

## 2019-04-09 ENCOUNTER — Other Ambulatory Visit: Payer: Self-pay | Admitting: Physician Assistant

## 2019-04-11 ENCOUNTER — Ambulatory Visit (HOSPITAL_COMMUNITY): Payer: Self-pay

## 2019-04-11 ENCOUNTER — Encounter (HOSPITAL_COMMUNITY): Payer: Self-pay

## 2019-04-17 ENCOUNTER — Ambulatory Visit: Payer: Medicaid Other | Admitting: Physician Assistant

## 2019-04-17 ENCOUNTER — Encounter: Payer: Self-pay | Admitting: Physician Assistant

## 2019-04-17 DIAGNOSIS — Z91199 Patient's noncompliance with other medical treatment and regimen due to unspecified reason: Secondary | ICD-10-CM

## 2019-04-17 DIAGNOSIS — Z5320 Procedure and treatment not carried out because of patient's decision for unspecified reasons: Secondary | ICD-10-CM

## 2019-04-17 DIAGNOSIS — F17200 Nicotine dependence, unspecified, uncomplicated: Secondary | ICD-10-CM

## 2019-04-17 DIAGNOSIS — F419 Anxiety disorder, unspecified: Secondary | ICD-10-CM

## 2019-04-17 DIAGNOSIS — Z9119 Patient's noncompliance with other medical treatment and regimen: Secondary | ICD-10-CM

## 2019-04-17 NOTE — Progress Notes (Signed)
   LMP 12/12/2010 (Approximate)    Subjective:    Patient ID: Whitney Powell, female    DOB: [birth year], 53 y.o.   MRN: [id number]  HPI: Whitney Powell is a 53 y.o. female presenting on 04/17/2019 for Follow-up   HPI  This is a telemedicine visit due to coronavirus pandemic.  It is via telephone as pt does not have smartphone with video capabilities  I connected with  Whitney Powell on 04/17/19 by a video enabled telemedicine application and verified that I am speaking with the correct person using two identifiers.   I discussed the limitations of evaluation and management by telemedicine. The patient expressed understanding and agreed to proceed.   Pt is sitting in her car on side of some street where it broke down.  Provider is at work  Pt saw orthopedics for her back in April.   Pt was referred to [facility] in March for anxiety but she hasn't been to [facility] due to fears of CV19  She is still working at [employer] through [employer]   She says her anxiety is much worse.    Pt never got labs drawn that were ordere to be done 3 months ago.   She attributes that to CV19 as well  She says she doesn't usually wear a mask when she goes into the grocery store because she doesn't go often.    Relevant past medical, surgical, family and social history reviewed and updated as indicated. Interim medical history since our last visit reviewed. Allergies and medications reviewed and updated.   Current Outpatient Medications:  .  cyclobenzaprine (FLEXERIL) 5 MG tablet, Take 1 tablet by mouth three times daily as needed, Disp: 15 tablet, Rfl: 0   Review of Systems  Per HPI unless specifically indicated above     Objective:    LMP 12/12/2010 (Approximate)   Wt Readings from Last 3 Encounters:  01/31/19 160 lb (72.6 kg)  01/07/19 138 lb 3.2 oz (62.7 kg)  10/02/18 165 lb (74.8 kg)    Physical Exam Pulmonary:     Effort: No respiratory distress.  Neurological:     Mental Status: She is  alert and oriented to person, place, and time.  Psychiatric:        Attention and Perception: Attention normal.        Behavior: Behavior is cooperative.         Assessment & Plan:   Encounter Diagnoses  Name Primary?  Marland Kitchen Anxiety Yes  . Tobacco use disorder   . Personal history of noncompliance with medical treatment, presenting hazards to health   . Screening mammography declined     -pt declines screening mammogram -Pt is encouraged to go to [facility] for anxiety -Pt is encouraged to get her fastiing labs drawn -encouraged pt to wear a mask when she goes to the store -discussed with pt that using precautions like wearing a mask and avoiding crowds will significantly reduce her CV19 risk and should limit her anxiety -will schedule follow up in 3 months to see if pt has the labs that can be reviewed.  She is to contact office sooner prn

## 2019-07-01 ENCOUNTER — Other Ambulatory Visit (HOSPITAL_COMMUNITY)
Admission: RE | Admit: 2019-07-01 | Discharge: 2019-07-01 | Disposition: A | Discharge status: Home / Self Care | Payer: Self-pay | Source: Ambulatory Visit | Attending: Physician Assistant | Admitting: Physician Assistant

## 2019-07-01 ENCOUNTER — Other Ambulatory Visit: Payer: Self-pay

## 2019-07-01 ENCOUNTER — Ambulatory Visit (HOSPITAL_COMMUNITY)
Admission: RE | Admit: 2019-07-01 | Discharge: 2019-07-01 | Disposition: A | Discharge status: Home / Self Care | Payer: Self-pay | Source: Ambulatory Visit | Attending: Physician Assistant | Admitting: Physician Assistant

## 2019-07-01 DIAGNOSIS — Z13220 Encounter for screening for lipoid disorders: Secondary | ICD-10-CM

## 2019-07-01 DIAGNOSIS — Z1239 Encounter for other screening for malignant neoplasm of breast: Secondary | ICD-10-CM | POA: Insufficient documentation

## 2019-07-01 DIAGNOSIS — R69 Illness, unspecified: Secondary | ICD-10-CM | POA: Insufficient documentation

## 2019-07-01 LAB — COMPREHENSIVE METABOLIC PANEL
ALT: 18 U/L (ref 0–44)
AST: 20 U/L (ref 15–41)
Albumin: 4.3 g/dL (ref 3.5–5.0)
Alkaline Phosphatase: 75 U/L (ref 38–126)
Anion gap: 8 (ref 5–15)
BUN: 13 mg/dL (ref 6–20)
CO2: 27 mmol/L (ref 22–32)
Calcium: 9.4 mg/dL (ref 8.9–10.3)
Chloride: 103 mmol/L (ref 98–111)
Creatinine, Ser: 0.75 mg/dL (ref 0.44–1.00)
GFR calc Af Amer: >60 mL/min (ref >60)
GFR calc non Af Amer: >60 mL/min (ref >60)
Glucose, Bld: 95 mg/dL (ref 70–99)
Potassium: 4 mmol/L (ref 3.5–5.1)
Sodium: 138 mmol/L (ref 135–145)
Total Bilirubin: 0.4 mg/dL (ref 0.3–1.2)
Total Protein: 8 g/dL (ref 6.5–8.1)

## 2019-07-01 LAB — LIPID PANEL
Cholesterol: 196 mg/dL (ref 0–200)
HDL: 88 mg/dL (ref >40)
LDL Cholesterol: 99 mg/dL (ref 0–99)
Total CHOL/HDL Ratio: 2.2 RATIO
Triglycerides: 43 mg/dL (ref <150)
VLDL: 9 mg/dL (ref 0–40)

## 2019-07-02 ENCOUNTER — Other Ambulatory Visit (HOSPITAL_COMMUNITY): Payer: Self-pay | Admitting: Physician Assistant

## 2019-07-02 DIAGNOSIS — R928 Other abnormal and inconclusive findings on diagnostic imaging of breast: Secondary | ICD-10-CM

## 2019-07-03 ENCOUNTER — Encounter: Payer: Self-pay | Admitting: Physician Assistant

## 2019-07-03 ENCOUNTER — Ambulatory Visit: Payer: Medicaid Other | Admitting: Physician Assistant

## 2019-07-03 DIAGNOSIS — Z91199 Patient's noncompliance with other medical treatment and regimen due to unspecified reason: Secondary | ICD-10-CM

## 2019-07-03 DIAGNOSIS — M545 Low back pain, unspecified: Secondary | ICD-10-CM

## 2019-07-03 DIAGNOSIS — F17200 Nicotine dependence, unspecified, uncomplicated: Secondary | ICD-10-CM

## 2019-07-03 DIAGNOSIS — F419 Anxiety disorder, unspecified: Secondary | ICD-10-CM

## 2019-07-03 DIAGNOSIS — Z9119 Patient's noncompliance with other medical treatment and regimen: Secondary | ICD-10-CM

## 2019-07-03 DIAGNOSIS — R928 Other abnormal and inconclusive findings on diagnostic imaging of breast: Secondary | ICD-10-CM

## 2019-07-03 NOTE — Progress Notes (Signed)
LMP 12/12/2010 (Approximate)    Subjective:    Patient ID: Whitney Powell, female    DOB: 1966/04/30, 53 y.o.   MRN: [id number]  HPI: Whitney Powell is a 53 y.o. female presenting on 07/03/2019 for No chief complaint on file.   HPI   This is a telemedicine appointment due to coronavirus pandemic.  It is via telephone as pt does not have a video enabled device.    I connected with  Whitney Powell on 07/03/19 by a video enabled telemedicine application and verified that I am speaking with the correct person using two identifiers.   I discussed the limitations of evaluation and management by telemedicine. The patient expressed understanding and agreed to proceed.  Pt is at home.  Provider is at office.      She hasn't worked through [employer] for several months.     Pt says she saw [facility] provider for disabiltiy determination but it was declined.     pt has still  never contacted [facility] for [place] issues as she was recommended to do at her first appointment in March.  She says she hasn't called because of the pandemic yet she went in for disability exam and she was seen by orthopedist in the office so when asked why she really hasn't called she starts crying and yelling profanities.   Pt is told that her appointment will end immediately if she continues with the profanities.   She stopped.   Pt went 1 time to orthopedics.  Per note, She has been treated with steroids, nsaids and muscle relaxer. recommended that next step would be MRI.     Relevant past medical, surgical, family and social history reviewed and updated as indicated. Interim medical history since our last visit reviewed. Allergies and medications reviewed and updated.   No current outpatient medications on file.     Review of Systems  Per HPI unless specifically indicated above     Objective:    LMP 12/12/2010 (Approximate)   Wt Readings from Last 3 Encounters:  01/31/19 160 lb (72.6 kg)  01/07/19 138 lb 3.2 oz  (62.7 kg)  10/02/18 165 lb (74.8 kg)    Physical Exam Pulmonary:     Effort: No respiratory distress.  Neurological:     Mental Status: She is alert and oriented to person, place, and time.  Psychiatric:        Attention and Perception: Attention normal.        Mood and Affect: Mood is anxious and depressed. Affect is angry.        Speech: Speech normal.        Behavior: Behavior is cooperative.     Results for orders placed or performed during the hospital encounter of 07/01/19  Comprehensive metabolic panel  Result Value Ref Range   Sodium 138 135 - 145 mmol/L   Potassium 4.0 3.5 - 5.1 mmol/L   Chloride 103 98 - 111 mmol/L   CO2 27 22 - 32 mmol/L   Glucose, Bld 95 70 - 99 mg/dL   BUN 13 6 - 20 mg/dL   Creatinine, Ser 0.75 0.44 - 1.00 mg/dL   Calcium 9.4 8.9 - 10.3 mg/dL   Total Protein 8.0 6.5 - 8.1 g/dL   Albumin 4.3 3.5 - 5.0 g/dL   AST 20 15 - 41 U/L   ALT 18 0 - 44 U/L   Alkaline Phosphatase 75 38 - 126 U/L   Total Bilirubin 0.4 0.3 - 1.2 mg/dL  GFR calc non Af Amer >60 >60 mL/min   GFR calc Af Amer >60 >60 mL/min   Anion gap 8 5 - 15  Lipid panel  Result Value Ref Range   Cholesterol 196 0 - 200 mg/dL   Triglycerides 43 <960<150 mg/dL   HDL 88 >45>40 mg/dL   Total CHOL/HDL Ratio 2.2 RATIO   VLDL 9 0 - 40 mg/dL   LDL Cholesterol 99 0 - 99 mg/dL      Assessment & Plan:   Encounter Diagnoses  Name Primary?  Marland Kitchen. Anxiety Yes  . Tobacco use disorder   . Personal history of noncompliance with medical treatment, presenting hazards to health   . Abnormal mammogram   . Low back pain without sciatica, unspecified back pain laterality, unspecified chronicity   \  -reviewed labs wiht pt   -Pt to contact financial counselor to check her percent discount with [facility].  She will let us know if she wants to proceed with MRI (based on if she has 100% discount)  -Pt to contact [facility] for mental health issues  -discussed abnormal mammogram that she had done on Monday.   Will arrange follow up testing  -pt to follow up 6week.  She is to contact office sooner prn

## 2019-07-12 ENCOUNTER — Ambulatory Visit (HOSPITAL_COMMUNITY): Payer: Medicaid Other

## 2019-07-12 ENCOUNTER — Encounter (HOSPITAL_COMMUNITY): Payer: Medicaid Other

## 2019-07-12 ENCOUNTER — Encounter (HOSPITAL_COMMUNITY): Payer: Self-pay

## 2019-07-30 ENCOUNTER — Ambulatory Visit (HOSPITAL_COMMUNITY): Payer: Medicaid Other

## 2019-07-30 ENCOUNTER — Other Ambulatory Visit (HOSPITAL_COMMUNITY): Payer: Medicaid Other

## 2019-07-30 ENCOUNTER — Inpatient Hospital Stay (HOSPITAL_COMMUNITY): Admission: RE | Admit: 2019-07-30 | Payer: Medicaid Other | Source: Ambulatory Visit

## 2019-08-06 ENCOUNTER — Ambulatory Visit (HOSPITAL_COMMUNITY)
Admission: RE | Admit: 2019-08-06 | Discharge: 2019-08-06 | Disposition: A | Discharge status: Home / Self Care | Payer: Self-pay | Source: Ambulatory Visit | Attending: Physician Assistant | Admitting: Physician Assistant

## 2019-08-06 ENCOUNTER — Ambulatory Visit (HOSPITAL_COMMUNITY): Admission: RE | Admit: 2019-08-06 | Payer: Self-pay | Source: Ambulatory Visit

## 2019-08-06 ENCOUNTER — Other Ambulatory Visit: Payer: Self-pay

## 2019-08-06 DIAGNOSIS — R928 Other abnormal and inconclusive findings on diagnostic imaging of breast: Secondary | ICD-10-CM | POA: Insufficient documentation

## 2019-08-12 ENCOUNTER — Ambulatory Visit: Payer: Medicaid Other | Admitting: Physician Assistant

## 2019-08-13 ENCOUNTER — Encounter: Payer: Self-pay | Admitting: Physician Assistant

## 2019-08-13 ENCOUNTER — Ambulatory Visit: Payer: Medicaid Other | Admitting: Physician Assistant

## 2019-08-13 DIAGNOSIS — F17200 Nicotine dependence, unspecified, uncomplicated: Secondary | ICD-10-CM

## 2019-08-13 DIAGNOSIS — M545 Low back pain, unspecified: Secondary | ICD-10-CM

## 2019-08-13 DIAGNOSIS — F39 Unspecified mood [affective] disorder: Secondary | ICD-10-CM

## 2019-08-13 MED ORDER — PREDNISONE 10 MG PO TABS: ORAL_TABLET | ORAL | 0 refills | Status: DC

## 2019-08-13 NOTE — Progress Notes (Signed)
   LMP 12/12/2010 (Approximate)    Subjective:    Patient ID: Whitney Powell, female    DOB: 11-Apr-1966, 53 y.o.   MRN: [id number]  HPI: Whitney Powell is a 53 y.o. female presenting on 08/13/2019 for No chief complaint on file.   HPI   This is a telemedicine appointment through [employer] due to coronavirus pandemic  I connected with  Whitney Powell on 08/13/19 by a video enabled telemedicine application and verified that I am speaking with the correct person using two identifiers.   I discussed the limitations of evaluation and management by telemedicine. The patient expressed understanding and agreed to proceed.  Pt is at home.  Provider is at office.    She went to [facility] doctor in [place]- she says she  Doesn't remember their name.    She says she didn't get rx.       She recently had diagnostic mammogram that was okay with recomendation to repeat screening mammogram 1 year  Pt c/o back pain  Pt talks on and on about [facility] [employer] care and needing a bill but she doesn't have one but then someone called her and told her she had one (a bill).    ??   Relevant past medical, surgical, family and social history reviewed and updated as indicated. Interim medical history since our last visit reviewed. Allergies and medications reviewed and updated.  No current outpatient medications on file.    Review of Systems  Per HPI unless specifically indicated above     Objective:    LMP 12/12/2010 (Approximate)   Wt Readings from Last 3 Encounters:  01/31/19 160 lb (72.6 kg)  01/07/19 138 lb 3.2 oz (62.7 kg)  10/02/18 165 lb (74.8 kg)    Physical Exam Constitutional:      General: She is not in acute distress.    Appearance: She is not ill-appearing.  HENT:     Head: Normocephalic and atraumatic.  Pulmonary:     Effort: Pulmonary effort is normal. No respiratory distress.  Neurological:     Mental Status: She is alert and oriented to person, place, and time.  Psychiatric:      Attention and Perception: Attention normal.        Speech: Speech normal.        Behavior: Behavior is cooperative.         Assessment & Plan:    Encounter Diagnoses  Name Primary?  . Mood disorder ([place]) Yes  . Tobacco use disorder   . Low back pain without sciatica, unspecified back pain laterality, unspecified chronicity       -Pt again encouraged to call [facility] for MH issues -rx prednisone  (sent to [facility]) for her back -Will order back xrays so if she needs a bill she will have one.  If she already has bill, she doesn't need to repeat the xrays -Will send her back to orthopedics for the MRI -Discussed with pt that she needs to discuss [facility] financial assistance program with financial counselor at [facility] as [facility] does not have any part in those decisions -encouraged smoking cessation -pt to follow up 1 month for telemedicine visit.  Will reenter orthopedics referral at that time if she is willing.  She is to contact office sooner prn

## 2019-09-16 ENCOUNTER — Ambulatory Visit: Payer: Medicaid Other | Admitting: Physician Assistant

## 2019-11-19 ENCOUNTER — Ambulatory Visit (HOSPITAL_COMMUNITY)
Admission: RE | Admit: 2019-11-19 | Discharge: 2019-11-19 | Disposition: A | Discharge status: Home / Self Care | Payer: Self-pay | Source: Ambulatory Visit | Attending: Physician Assistant | Admitting: Physician Assistant

## 2019-11-19 ENCOUNTER — Other Ambulatory Visit: Payer: Self-pay

## 2019-11-19 DIAGNOSIS — M545 Low back pain, unspecified: Secondary | ICD-10-CM

## 2019-12-23 ENCOUNTER — Other Ambulatory Visit: Payer: Self-pay | Admitting: Physician Assistant

## 2020-01-27 ENCOUNTER — Other Ambulatory Visit: Payer: Self-pay | Admitting: Physician Assistant

## 2020-01-28 ENCOUNTER — Ambulatory Visit: Payer: Medicaid Other | Admitting: Orthopaedic Surgery

## 2020-02-04 ENCOUNTER — Other Ambulatory Visit: Payer: Self-pay

## 2020-02-04 ENCOUNTER — Ambulatory Visit: Payer: Self-pay | Admitting: Orthopaedic Surgery

## 2020-02-04 ENCOUNTER — Encounter: Payer: Self-pay | Admitting: Orthopaedic Surgery

## 2020-02-04 VITALS — BP 95/63 | HR 100 | Temp 97.9°F | Ht 65.0 in | Wt 154.0 lb

## 2020-02-04 DIAGNOSIS — F17210 Nicotine dependence, cigarettes, uncomplicated: Secondary | ICD-10-CM

## 2020-02-04 DIAGNOSIS — K219 Gastro-esophageal reflux disease without esophagitis: Secondary | ICD-10-CM | POA: Insufficient documentation

## 2020-02-04 DIAGNOSIS — M5442 Lumbago with sciatica, left side: Secondary | ICD-10-CM

## 2020-02-04 DIAGNOSIS — G8929 Other chronic pain: Secondary | ICD-10-CM

## 2020-02-04 NOTE — Progress Notes (Signed)
Subjective:    Patient ID: Whitney Powell, female    DOB: [birth year], 54 y.o.   MRN: [id number]  HPI She has long history of lower back pain with left sided sciatica.  She saw Dr. Lorin Mercy last year for this in April.  She has had x-rays of the lumbar spine last year and in January of this year.  I have independently reviewed and interpreted x-rays of this patient done at another site by another physician or qualified health professional.  She has pain running down the left leg to the left lateral foot.  Nothing seems to help.  She has been seen at the [facility] and referred here.  She has no trauma, no weakness, no paralysis,no bowel or bladder problems. She is not getting any better.  She has been on Flexeril and prednisone with only slight help.  I will get MRI as I am concerned about HNP.   Review of Systems  Constitutional: Positive for activity change.  Respiratory: Positive for shortness of breath.   Musculoskeletal: Positive for arthralgias and back pain.  Psychiatric/Behavioral: The patient is nervous/anxious.   All other systems reviewed and are negative.  For Review of Systems, all other systems reviewed and are negative.  The following is a summary of the past history medically, past history surgically, known current medicines, social history and family history.  This information is gathered electronically by the computer from prior information and documentation.  I review this each visit and have found including this information at this point in the chart is beneficial and informative.   Past Medical History:  Diagnosis Date  . Anxiety   . Bronchitis   . Depression   . GERD (gastroesophageal reflux disease)   . Neuromuscular disorder (HCC)    bilat carpal tunnel synd  . Tobacco abuse 12/12/2016    Past Surgical History:  Procedure Laterality Date  . TUBAL LIGATION  1990    Current Outpatient Medications on File Prior to Visit  Medication Sig Dispense Refill   . cyclobenzaprine (FLEXERIL) 5 MG tablet Take 1 tablet by mouth three times daily as needed 15 tablet 0   No current facility-administered medications on file prior to visit.    Social History   Socioeconomic History  . Marital status: Single    Spouse name: Not on file  . Number of children: 1  . Years of education: 110  . Highest education level: Not on file  Occupational History  . Not on file  Tobacco Use  . Smoking status: Current Every Day Smoker    Packs/day: 0.25    Years: 46.00    Pack years: 11.50    Types: Cigarettes    Start date: 10/31/1980  . Smokeless tobacco: Never Used  . Tobacco comment: discussed  Substance and Sexual Activity  . Alcohol use: Yes    Alcohol/week: 1.0 standard drinks    Types: 1 Cans of beer per week    Comment: occassional  . Drug use: No  . Sexual activity: Not Currently  Other Topics Concern  . Not on file  Social History Narrative   Lives alone   Son Marjory Lies lives near   Social Determinants of Health   Financial Resource Strain:   . Difficulty of Paying Living Expenses:   Food Insecurity:   . Worried About Charity fundraiser in the Last Year:   . Arboriculturist in the Last Year:   Transportation Needs:   . Lack of Transportation (  Medical):   Marland Kitchen Lack of Transportation (Non-Medical):   Physical Activity:   . Days of Exercise per Week:   . Minutes of Exercise per Session:   Stress:   . Feeling of Stress :   Social Connections:   . Frequency of Communication with Friends and Family:   . Frequency of Social Gatherings with Friends and Family:   . Attends Religious Services:   . Active Member of Clubs or Organizations:   . Attends Banker Meetings:   Marland Kitchen Marital Status:   Intimate Partner Violence:   . Fear of Current or Ex-Partner:   . Emotionally Abused:   Marland Kitchen Physically Abused:   . Sexually Abused:     Family History  Problem Relation Age of Onset  . Heart disease Mother   . Hyperlipidemia Mother   .  Hypertension Mother   . Stroke Mother   . Cancer Father        pancrease  . Diabetes Father   . Hearing loss Father   . Cancer Sister        Corky Mull  . Hepatitis C Sister   . Cancer Paternal Grandmother        pancreatic cancer    BP 95/63   Pulse 100   Temp 97.9 F (36.6 C)   Ht 5\' 5"  (1.651 m)   Wt 154 lb (69.9 kg)   LMP 12/12/2010 (Approximate)   BMI 25.63 kg/m   Body mass index is 25.63 kg/m.     Objective:   Physical Exam Vitals and nursing note reviewed.  Constitutional:      Appearance: She is well-developed.  HENT:     Head: Normocephalic and atraumatic.  Eyes:     Conjunctiva/sclera: Conjunctivae normal.     Pupils: Pupils are equal, round, and reactive to light.  Cardiovascular:     Rate and Rhythm: Normal rate and regular rhythm.  Pulmonary:     Effort: Pulmonary effort is normal.  Abdominal:     Palpations: Abdomen is soft.  Musculoskeletal:       Arms:     Cervical back: Normal range of motion and neck supple.  Skin:    General: Skin is warm and dry.  Neurological:     Mental Status: She is alert and oriented to person, place, and time.     Cranial Nerves: No cranial nerve deficit.     Motor: No abnormal muscle tone.     Coordination: Coordination normal.     Deep Tendon Reflexes: Reflexes are normal and symmetric. Reflexes normal.  Psychiatric:        Behavior: Behavior normal.        Thought Content: Thought content normal.        Judgment: Judgment normal.           Assessment & Plan:   Encounter Diagnoses  Name Primary?  . Chronic left-sided low back pain with left-sided sciatica Yes  . Nicotine dependence, cigarettes, uncomplicated   . Gastroesophageal reflux disease without esophagitis    Get MRI of the lumbar spine.  Return in three weeks.  Call if any problem.  Precautions discussed.   Electronically Signed 02/10/2011, MD 4/6/20212:18 PM

## 2020-02-19 ENCOUNTER — Other Ambulatory Visit (HOSPITAL_COMMUNITY)
Admission: RE | Admit: 2020-02-19 | Discharge: 2020-02-19 | Disposition: A | Discharge status: Home / Self Care | Payer: Medicaid Other | Source: Ambulatory Visit | Attending: Physician Assistant | Admitting: Physician Assistant

## 2020-02-19 ENCOUNTER — Ambulatory Visit: Payer: Medicaid Other | Admitting: Physician Assistant

## 2020-02-19 ENCOUNTER — Other Ambulatory Visit: Payer: Self-pay

## 2020-02-19 ENCOUNTER — Encounter: Payer: Self-pay | Admitting: Physician Assistant

## 2020-02-19 VITALS — BP 100/70 | HR 100 | Temp 98.2°F | Wt 158.5 lb

## 2020-02-19 DIAGNOSIS — Z1151 Encounter for screening for human papillomavirus (HPV): Secondary | ICD-10-CM | POA: Diagnosis not present

## 2020-02-19 DIAGNOSIS — Z124 Encounter for screening for malignant neoplasm of cervix: Secondary | ICD-10-CM | POA: Insufficient documentation

## 2020-02-19 DIAGNOSIS — F17200 Nicotine dependence, unspecified, uncomplicated: Secondary | ICD-10-CM

## 2020-02-19 DIAGNOSIS — R87610 Atypical squamous cells of undetermined significance on cytologic smear of cervix (ASC-US): Secondary | ICD-10-CM | POA: Insufficient documentation

## 2020-02-19 DIAGNOSIS — R87810 Cervical high risk human papillomavirus (HPV) DNA test positive: Secondary | ICD-10-CM | POA: Insufficient documentation

## 2020-02-19 NOTE — Patient Instructions (Signed)
Covid vaccine-  Tuesday May 11- 9am-2pm

## 2020-02-19 NOTE — Progress Notes (Signed)
   LMP 12/12/2010 (Approximate)    Subjective:    Patient ID: Whitney Powell, female    DOB: 11/25/1965, 54 y.o.   MRN: [id number]  HPI: Whitney Powell is a 54 y.o. female presenting on 02/19/2020 for No chief complaint on file.   HPI  Pt had a negative covid 19 screening questionnaire  Pt presents today to update PAP.  Pt is concerned about STD.  Discussed with pt that [facility] cannot perform STD testing but can update PAP and pt agrees.   Relevant past medical, surgical, family and social history reviewed and updated as indicated. Interim medical history since our last visit reviewed. Allergies and medications reviewed and updated.  CURRENT MEDS: flexeril  Review of Systems  Per HPI unless specifically indicated above     Objective:    LMP 12/12/2010 (Approximate)   Wt Readings from Last 3 Encounters:  02/04/20 154 lb (69.9 kg)  01/31/19 160 lb (72.6 kg)  01/07/19 138 lb 3.2 oz (62.7 kg)    Physical Exam Vitals and nursing note reviewed.  Constitutional:      General: She is not in acute distress.    Appearance: Normal appearance. She is well-developed. She is not ill-appearing.  HENT:     Head: Normocephalic and atraumatic.  Cardiovascular:     Rate and Rhythm: Normal rate and regular rhythm.  Pulmonary:     Effort: Pulmonary effort is normal.     Breath sounds: Normal breath sounds.  Abdominal:     General: Bowel sounds are normal.     Palpations: Abdomen is soft. There is no mass.     Tenderness: There is no abdominal tenderness. There is no guarding or rebound.  Genitourinary:    Labia:        Right: No rash, tenderness or lesion.        Left: No rash, tenderness or lesion.      Vagina: Normal.     Cervix: No cervical motion tenderness, discharge or friability.     Adnexa:        Right: No mass, tenderness or fullness.         Left: No mass, tenderness or fullness.       Comments: (nurse Berenice assisted) Musculoskeletal:     Cervical back: Neck  supple.     Right lower leg: No edema.     Left lower leg: No edema.  Lymphadenopathy:     Cervical: No cervical adenopathy.  Skin:    General: Skin is warm and dry.  Neurological:     Mental Status: She is alert and oriented to person, place, and time.  Psychiatric:        Attention and Perception: Attention normal.        Speech: Speech normal.        Behavior: Behavior normal. Behavior is cooperative.         Assessment & Plan:       Encounter Diagnoses  Name Primary?  . Routine Papanicolaou smear Yes  . Tobacco use disorder     Pt encouraged to get STD testing.  Recommended she contact [facility]. Encouraged smoking cessation She is scheduled for covid vaccination Pt to follow up 1 year.  She is to contact office sooner prn

## 2020-02-24 LAB — CYTOLOGY - PAP
Comment: NEGATIVE
Diagnosis: UNDETERMINED — AB
High risk HPV: POSITIVE — AB

## 2020-02-25 ENCOUNTER — Ambulatory Visit: Payer: Self-pay | Admitting: Orthopaedic Surgery

## 2020-03-11 ENCOUNTER — Other Ambulatory Visit: Payer: Self-pay | Admitting: Physician Assistant

## 2020-03-16 ENCOUNTER — Ambulatory Visit (HOSPITAL_COMMUNITY): Payer: Self-pay

## 2020-03-17 ENCOUNTER — Ambulatory Visit (HOSPITAL_COMMUNITY): Payer: Medicaid Other

## 2020-03-19 ENCOUNTER — Ambulatory Visit: Payer: Self-pay | Admitting: Orthopaedic Surgery

## 2020-03-28 ENCOUNTER — Ambulatory Visit (HOSPITAL_COMMUNITY)
Admission: RE | Admit: 2020-03-28 | Discharge: 2020-03-28 | Disposition: A | Discharge status: Home / Self Care | Payer: Self-pay | Source: Ambulatory Visit | Attending: Orthopaedic Surgery | Admitting: Orthopaedic Surgery

## 2020-03-28 ENCOUNTER — Other Ambulatory Visit: Payer: Self-pay

## 2020-03-28 DIAGNOSIS — G8929 Other chronic pain: Secondary | ICD-10-CM | POA: Insufficient documentation

## 2020-03-28 DIAGNOSIS — M5442 Lumbago with sciatica, left side: Secondary | ICD-10-CM | POA: Insufficient documentation

## 2020-03-31 ENCOUNTER — Ambulatory Visit (INDEPENDENT_AMBULATORY_CARE_PROVIDER_SITE_OTHER): Payer: Self-pay | Admitting: Orthopaedic Surgery

## 2020-03-31 ENCOUNTER — Encounter: Payer: Self-pay | Admitting: Orthopaedic Surgery

## 2020-03-31 ENCOUNTER — Other Ambulatory Visit: Payer: Self-pay

## 2020-03-31 VITALS — Ht 65.0 in | Wt 155.0 lb

## 2020-03-31 DIAGNOSIS — M5442 Lumbago with sciatica, left side: Secondary | ICD-10-CM

## 2020-03-31 DIAGNOSIS — G8929 Other chronic pain: Secondary | ICD-10-CM

## 2020-03-31 MED ORDER — CYCLOBENZAPRINE HCL 5 MG PO TABS: 5.0000 mg | ORAL_TABLET | Freq: Three times a day (TID) | ORAL | 0 refills | Status: DC | PRN

## 2020-03-31 NOTE — Progress Notes (Signed)
Patient Whitney Powell, female DOB:Jun 22, 1966, 54 y.o. YWV:[id number]  Chief Complaint  Patient presents with  . Back Pain    LBP    HPI  Whitney Powell is a 54 y.o. female who has lower back pain.  She had MRI which showed: IMPRESSION: 1. At L3-4 there is a large broad-based disc bulge with a small central disc protrusion. Severe bilateral facet arthropathy with ligamentum flavum infolding. Severe spinal stenosis. Moderate bilateral foraminal stenosis. 2. At L4-5 there is a broad-based disc bulge. Severe bilateral facet arthropathy. Severe spinal stenosis. Moderate bilateral foraminal stenosis. 3. At L5-S1 there is a broad-based disc bulge with a small central disc protrusion. Moderate bilateral facet arthropathy. Moderate bilateral foraminal stenosis. Bilateral subarticular recess narrowing.  I have independently reviewed the MRI.  I have explained the findings to her.  I have recommended epidural.  I will refill her Flexeril.  I will see her in six weeks.   Body mass index is 25.79 kg/m.  ROS  Review of Systems  Constitutional: Positive for activity change.  Respiratory: Positive for shortness of breath.   Musculoskeletal: Positive for arthralgias and back pain.  Psychiatric/Behavioral: The patient is nervous/anxious.   All other systems reviewed and are negative.   All other systems reviewed and are negative.  The following is a summary of the past history medically, past history surgically, known current medicines, social history and family history.  This information is gathered electronically by the computer from prior information and documentation.  I review this each visit and have found including this information at this point in the chart is beneficial and informative.    Past Medical History:  Diagnosis Date  . Anxiety   . Bronchitis   . Depression   . GERD (gastroesophageal reflux disease)   . Neuromuscular disorder (HCC)    bilat carpal tunnel synd  .  Tobacco abuse 12/12/2016    Past Surgical History:  Procedure Laterality Date  . TUBAL LIGATION  1990    Family History  Problem Relation Age of Onset  . Heart disease Mother   . Hyperlipidemia Mother   . Hypertension Mother   . Stroke Mother   . Cancer Father        pancrease  . Diabetes Father   . Hearing loss Father   . Cancer Sister        Whitney Powell  . Hepatitis C Sister   . Cancer Paternal Grandmother        pancreatic cancer    Social History Social History   Tobacco Use  . Smoking status: Current Every Day Smoker    Packs/day: 0.50    Years: 46.00    Pack years: 23.00    Types: Cigarettes    Start date: 10/31/1980  . Smokeless tobacco: Never Used  . Tobacco comment: discussed  Substance Use Topics  . Alcohol use: Yes    Alcohol/week: 1.0 standard drinks    Types: 1 Cans of beer per week    Comment: occassional  . Drug use: No    No Known Allergies  Current Outpatient Medications  Medication Sig Dispense Refill  . cyclobenzaprine (FLEXERIL) 5 MG tablet Take 1 tablet (5 mg total) by mouth 3 (three) times daily as needed. 45 tablet 0   No current facility-administered medications for this visit.     Physical Exam  Height 5\' 5"  (1.651 m), weight 155 lb (70.3 kg), last menstrual period 12/12/2010.  Constitutional: overall normal hygiene, normal nutrition, well developed, normal grooming, normal  body habitus. Assistive device:none  Musculoskeletal: gait and station Limp none, muscle tone and strength are normal, no tremors or atrophy is present.  .  Neurological: coordination overall normal.  Deep tendon reflex/nerve stretch intact.  Sensation normal.  Cranial nerves II-XII intact.   Skin:   Normal overall no scars, lesions, ulcers or rashes. No psoriasis.  Psychiatric: Alert and oriented x 3.  Recent memory intact, remote memory unclear.  Normal mood and affect. Well groomed.  Good eye contact.  Cardiovascular: overall no swelling, no varicosities,  no edema bilaterally, normal temperatures of the legs and arms, no clubbing, cyanosis and good capillary refill.  Lymphatic: palpation is normal.  Spine/Pelvis examination:  Inspection:  Overall, sacoiliac joint benign and hips nontender; without crepitus or defects.   Thoracic spine inspection: Alignment normal without kyphosis present   Lumbar spine inspection:  Alignment  with normal lumbar lordosis, without scoliosis apparent.   Thoracic spine palpation:  without tenderness of spinal processes   Lumbar spine palpation: without tenderness of lumbar area; without tightness of lumbar muscles    Range of Motion:   Lumbar flexion, forward flexion is normal without pain or tenderness    Lumbar extension is full without pain or tenderness   Left lateral bend is normal without pain or tenderness   Right lateral bend is normal without pain or tenderness   Straight leg raising is normal  Strength & tone: normal   Stability overall normal stability  All other systems reviewed and are negative   The patient has been educated about the nature of the problem(s) and counseled on treatment options.  The patient appeared to understand what I have discussed and is in agreement with it.  Encounter Diagnosis  Name Primary?  . Chronic left-sided low back pain with left-sided sciatica Yes    PLAN Call if any problems.  Precautions discussed.  Continue current medications.   Return to clinic 6 weeks   Electronically Signed Darreld Mclean, MD 6/1/20212:31 PM

## 2020-04-07 ENCOUNTER — Telehealth: Payer: Self-pay | Admitting: Physical Medicine and Rehabilitation

## 2020-04-07 ENCOUNTER — Telehealth: Payer: Self-pay | Admitting: *Deleted

## 2020-04-07 NOTE — Telephone Encounter (Signed)
Pt called wanting to get an appt scheduled.   [phone]

## 2020-04-07 NOTE — Telephone Encounter (Signed)
Called pt and lvm #1 

## 2020-04-08 ENCOUNTER — Telehealth: Payer: Self-pay | Admitting: Physical Medicine and Rehabilitation

## 2020-04-08 NOTE — Telephone Encounter (Signed)
Patient called. She missed a call to schedule an appointment with Dr. Alvester Morin. Would like someone to call her back.

## 2020-04-09 NOTE — Telephone Encounter (Signed)
Called pt and lvm #2 

## 2020-04-21 ENCOUNTER — Telehealth: Payer: Self-pay | Admitting: Physical Medicine and Rehabilitation

## 2020-04-21 NOTE — Telephone Encounter (Signed)
Patient called.   She was referred to Dr.Newton for an injection but due to phone issues, she's been missing our calls to schedule. Requesting another call back  Call back: [phone]

## 2020-04-21 NOTE — Telephone Encounter (Signed)
Appt is 05/27/20 with driver.

## 2020-05-12 ENCOUNTER — Ambulatory Visit: Payer: Medicaid Other | Admitting: Orthopaedic Surgery

## 2020-05-27 ENCOUNTER — Ambulatory Visit: Payer: Self-pay

## 2020-05-27 ENCOUNTER — Encounter: Payer: Self-pay | Admitting: Physical Medicine and Rehabilitation

## 2020-05-27 ENCOUNTER — Other Ambulatory Visit: Payer: Self-pay

## 2020-05-27 ENCOUNTER — Ambulatory Visit (INDEPENDENT_AMBULATORY_CARE_PROVIDER_SITE_OTHER): Payer: Self-pay | Admitting: Physical Medicine and Rehabilitation

## 2020-05-27 VITALS — BP 100/71 | HR 79

## 2020-05-27 DIAGNOSIS — M48062 Spinal stenosis, lumbar region with neurogenic claudication: Secondary | ICD-10-CM

## 2020-05-27 DIAGNOSIS — M5416 Radiculopathy, lumbar region: Secondary | ICD-10-CM

## 2020-05-27 MED ORDER — METHYLPREDNISOLONE ACETATE 80 MG/ML IJ SUSP
80.0000 mg | Freq: Once | INTRAMUSCULAR | Status: AC
  Administered 2020-05-27: 80 mg

## 2020-05-27 NOTE — Progress Notes (Signed)
Pt states lower back pain. Pt states standing makes the pain worse. Pt states both legs feel numbness. Pt states laying on back back makes the pain better.   Numeric Pain Rating Scale and Functional Assessment Average Pain 7   In the last MONTH (on 0-10 scale) has pain interfered with the following?  1. General activity like being  able to carry out your everyday physical activities such as walking, climbing stairs, carrying groceries, or moving a chair?  Rating(10)   +Driver, -BT, -Dye Allergies.

## 2020-05-27 NOTE — Progress Notes (Signed)
Whitney Powell - 54 y.o. female MRN [id number]  Date of birth: 26-Apr-1966  Office Visit Note: Visit Date: 05/27/2020 PCP: Jacquelin Hawking, PA-C Referred by: Jacquelin Hawking, PA-C  Subjective: Chief Complaint  Patient presents with  . Lower Back - Pain  . Left Leg - Numbness  . Right Leg - Numbness   HPI:  Whitney Powell is a 54 y.o. female who comes in today at the request of Dr. Darreld Mclean for planned Bilateral L4-L5 Lumbar epidural steroid injection with fluoroscopic guidance.  The patient has failed conservative care including home exercise, medications, time and activity modification.  This injection will be diagnostic and hopefully therapeutic.  Please see requesting physician notes for further details and justification.   ROS Otherwise per HPI.  Assessment & Plan: Visit Diagnoses:  1. Lumbar radiculopathy   2. Spinal stenosis of lumbar region with neurogenic claudication     Plan: Findings:  A "series of 3" injections is not recommended and is outdated in the age of fluoroscopically guided injections as well as advanced imaging of the spine.  The injections are used both diagnostically and therapeutically and usually 1 to 2 injections will effectively treat the patient.  Occasionally there is another pain source identified and we may even do more than 3 injections depending on the source of pain.  A lot of insurance companies will not allow repeat injections without evaluation.    Meds & Orders:  Meds ordered this encounter  Medications  . methylPREDNISolone acetate (DEPO-MEDROL) injection 80 mg    Orders Placed This Encounter  Procedures  . XR C-ARM NO REPORT  . Epidural Steroid injection    Follow-up: Return if symptoms worsen or fail to improve.   Procedures: No procedures performed  Lumbosacral Transforaminal Epidural Steroid Injection - Sub-Pedicular Approach with Fluoroscopic Guidance  Patient: Whitney Powell      Date of Birth: April 12, 1966 MRN:  [id number] PCP: Jacquelin Hawking, PA-C      Visit Date: 05/27/2020   Universal Protocol:    Date/Time: 05/27/2020  Consent Given By: the patient  Position: PRONE  Additional Comments: Vital signs were monitored before and after the procedure. Patient was prepped and draped in the usual sterile fashion. The correct patient, procedure, and site was verified.   Injection Procedure Details:  Procedure Site One Meds Administered:  Meds ordered this encounter  Medications  . methylPREDNISolone acetate (DEPO-MEDROL) injection 80 mg    Laterality: Bilateral  Location/Site:  L4-L5  Needle size: 22 G  Needle type: Spinal  Needle Placement: Transforaminal  Findings:    -Comments: Excellent flow of contrast along the nerve, nerve root and into the epidural space.  Procedure Details: After squaring off the end-plates to get a true AP view, the C-arm was positioned so that an oblique view of the foramen as noted above was visualized. The target area is just inferior to the "nose of the scotty dog" or sub pedicular. The soft tissues overlying this structure were infiltrated with 2-3 ml. of 1% Lidocaine without Epinephrine.  The spinal needle was inserted toward the target using a "trajectory" view along the fluoroscope beam.  Under AP and lateral visualization, the needle was advanced so it did not puncture dura and was located close the 6 O'Clock position of the pedical in AP tracterory. Biplanar projections were used to confirm position. Aspiration was confirmed to be negative for CSF and/or blood. A 1-2 ml. volume of Isovue-250 was injected and flow of contrast was noted at each level. Radiographs  were obtained for documentation purposes.   After attaining the desired flow of contrast documented above, a 0.5 to 1.0 ml test dose of 0.25% Marcaine was injected into each respective transforaminal space.  The patient was observed for 90 seconds post injection.  After no sensory deficits  were reported, and normal lower extremity motor function was noted,   the above injectate was administered so that equal amounts of the injectate were placed at each foramen (level) into the transforaminal epidural space.   Additional Comments:  The patient tolerated the procedure well Dressing: 2 x 2 sterile gauze and Band-Aid    Post-procedure details: Patient was observed during the procedure. Post-procedure instructions were reviewed.  Patient left the clinic in stable condition.      Clinical History: CLINICAL DATA:  Low back pain, left-sided low back pain with left-sided sciatica  EXAM: MRI LUMBAR SPINE WITHOUT CONTRAST  TECHNIQUE: Multiplanar, multisequence MR imaging of the lumbar spine was performed. No intravenous contrast was administered.  COMPARISON:  None.  FINDINGS: Segmentation:  Standard.  Alignment:  Physiologic.  Vertebrae:  No fracture, evidence of discitis, or bone lesion.  Conus medullaris and cauda equina: Conus extends to the T12-L1 level. Conus and cauda equina appear normal.  Paraspinal and other soft tissues: No acute paraspinal abnormality.  Disc levels:  Disc spaces: Degenerative disease with disc height loss at L3-4 and L5-S1.  T12-L1: No significant disc bulge. No evidence of neural foraminal stenosis. No central canal stenosis.  L1-L2: No significant disc bulge. No evidence of neural foraminal stenosis. No central canal stenosis. Mild bilateral facet arthropathy.  L2-L3: Mild broad-based disc bulge. Mild bilateral facet arthropathy. No evidence of neural foraminal stenosis. No central canal stenosis.  L3-L4: Large broad-based disc bulge with a small central disc protrusion. Severe bilateral facet arthropathy with ligamentum flavum infolding. Severe spinal stenosis. Moderate bilateral foraminal stenosis.  L4-L5: Broad-based disc bulge. Severe bilateral facet arthropathy. Severe spinal stenosis. Moderate  bilateral foraminal stenosis.  L5-S1: Broad-based disc bulge with a small central disc protrusion. Moderate bilateral facet arthropathy. Moderate bilateral foraminal stenosis. No central canal stenosis. Bilateral subarticular recess narrowing.  IMPRESSION: 1. At L3-4 there is a large broad-based disc bulge with a small central disc protrusion. Severe bilateral facet arthropathy with ligamentum flavum infolding. Severe spinal stenosis. Moderate bilateral foraminal stenosis. 2. At L4-5 there is a broad-based disc bulge. Severe bilateral facet arthropathy. Severe spinal stenosis. Moderate bilateral foraminal stenosis. 3. At L5-S1 there is a broad-based disc bulge with a small central disc protrusion. Moderate bilateral facet arthropathy. Moderate bilateral foraminal stenosis. Bilateral subarticular recess narrowing.   Electronically Signed   By: Elige Ko   On: 03/29/2020 15:56     Objective:  VS:  HT:    WT:   BMI:     BP:100/71  HR:79bpm  TEMP: ( )  RESP:  Physical Exam Constitutional:      General: She is not in acute distress.    Appearance: Normal appearance. She is not ill-appearing.  HENT:     Head: Normocephalic and atraumatic.     Right Ear: External ear normal.     Left Ear: External ear normal.  Eyes:     Extraocular Movements: Extraocular movements intact.  Cardiovascular:     Rate and Rhythm: Normal rate.     Pulses: Normal pulses.  Musculoskeletal:     Right lower leg: No edema.     Left lower leg: No edema.     Comments: Patient has good  distal strength with no pain over the greater trochanters.  No clonus or focal weakness.  Skin:    Findings: No erythema, lesion or rash.  Neurological:     General: No focal deficit present.     Mental Status: She is alert and oriented to person, place, and time.     Sensory: No sensory deficit.     Motor: No weakness or abnormal muscle tone.     Coordination: Coordination normal.  Psychiatric:         Mood and Affect: Mood normal.        Behavior: Behavior normal.      Imaging: No results found.

## 2020-06-11 ENCOUNTER — Other Ambulatory Visit: Payer: Self-pay

## 2020-06-11 ENCOUNTER — Encounter: Payer: Self-pay | Admitting: Orthopaedic Surgery

## 2020-06-11 ENCOUNTER — Ambulatory Visit (INDEPENDENT_AMBULATORY_CARE_PROVIDER_SITE_OTHER): Payer: Self-pay | Admitting: Orthopaedic Surgery

## 2020-06-11 VITALS — BP 104/74 | HR 81 | Ht 65.0 in | Wt 155.0 lb

## 2020-06-11 DIAGNOSIS — F17210 Nicotine dependence, cigarettes, uncomplicated: Secondary | ICD-10-CM

## 2020-06-11 DIAGNOSIS — M5442 Lumbago with sciatica, left side: Secondary | ICD-10-CM

## 2020-06-11 DIAGNOSIS — G8929 Other chronic pain: Secondary | ICD-10-CM

## 2020-06-11 MED ORDER — CYCLOBENZAPRINE HCL 5 MG PO TABS: 5.0000 mg | ORAL_TABLET | Freq: Three times a day (TID) | ORAL | 0 refills | Status: DC | PRN

## 2020-06-11 NOTE — Progress Notes (Signed)
Patient Whitney Powell, female DOB:[birth year], 54 y.o. KPT:[id number]  Chief Complaint  Patient presents with  . Back Pain    chronic left side, still hurting some.     HPI  Whitney Powell is a 54 y.o. female who has lower back pain.  She had epidural injection by Dr. Alvester Morin.  She says it has not helped that much yet but it has been only a week.  I have reviewed Dr. Victoria Blas notes.  I will refill her Flexeril.  She will call him if the pain continues to have second epidural.   Body mass index is 25.79 kg/m.  ROS  Review of Systems  Constitutional: Positive for activity change.  Respiratory: Positive for shortness of breath.   Musculoskeletal: Positive for arthralgias and back pain.  Psychiatric/Behavioral: The patient is nervous/anxious.   All other systems reviewed and are negative.   All other systems reviewed and are negative.  The following is a summary of the past history medically, past history surgically, known current medicines, social history and family history.  This information is gathered electronically by the computer from prior information and documentation.  I review this each visit and have found including this information at this point in the chart is beneficial and informative.    Past Medical History:  Diagnosis Date  . Anxiety   . Bronchitis   . Depression   . GERD (gastroesophageal reflux disease)   . Neuromuscular disorder (HCC)    bilat carpal tunnel synd  . Tobacco abuse 12/12/2016    Past Surgical History:  Procedure Laterality Date  . TUBAL LIGATION  1990    Family History  Problem Relation Age of Onset  . Heart disease Mother   . Hyperlipidemia Mother   . Hypertension Mother   . Stroke Mother   . Cancer Father        pancrease  . Diabetes Father   . Hearing loss Father   . Cancer Sister        Corky Mull  . Hepatitis C Sister   . Cancer Paternal Grandmother        pancreatic cancer    Social History Social History   Tobacco Use   . Smoking status: Current Every Day Smoker    Packs/day: 0.50    Years: 46.00    Pack years: 23.00    Types: Cigarettes    Start date: 10/31/1980  . Smokeless tobacco: Never Used  . Tobacco comment: discussed  Vaping Use  . Vaping Use: Former  Substance Use Topics  . Alcohol use: Yes    Alcohol/week: 1.0 standard drink    Types: 1 Cans of beer per week    Comment: occassional  . Drug use: No    No Known Allergies  Current Outpatient Medications  Medication Sig Dispense Refill  . cyclobenzaprine (FLEXERIL) 5 MG tablet Take 1 tablet (5 mg total) by mouth 3 (three) times daily as needed. 45 tablet 0   No current facility-administered medications for this visit.     Physical Exam  Blood pressure 104/74, pulse 81, height 5\' 5"  (1.651 m), weight 155 lb (70.3 kg), last menstrual period 12/12/2010.  Constitutional: overall normal hygiene, normal nutrition, well developed, normal grooming, normal body habitus. Assistive device:cane  Musculoskeletal: gait and station Limp none, muscle tone and strength are normal, no tremors or atrophy is present.  .  Neurological: coordination overall normal.  Deep tendon reflex/nerve stretch intact.  Sensation normal.  Cranial nerves II-XII intact.   Skin:  Normal overall no scars, lesions, ulcers or rashes. No psoriasis.  Psychiatric: Alert and oriented x 3.  Recent memory intact, remote memory unclear.  Normal mood and affect. Well groomed.  Good eye contact.  Cardiovascular: overall no swelling, no varicosities, no edema bilaterally, normal temperatures of the legs and arms, no clubbing, cyanosis and good capillary refill.  Lymphatic: palpation is normal.  Spine/Pelvis examination:  Inspection:  Overall, sacoiliac joint benign and hips nontender; without crepitus or defects.   Thoracic spine inspection: Alignment normal without kyphosis present   Lumbar spine inspection:  Alignment  with normal lumbar lordosis, without scoliosis  apparent.   Thoracic spine palpation:  without tenderness of spinal processes   Lumbar spine palpation: without tenderness of lumbar area; without tightness of lumbar muscles    Range of Motion:   Lumbar flexion, forward flexion is normal without pain or tenderness    Lumbar extension is full without pain or tenderness   Left lateral bend is normal without pain or tenderness   Right lateral bend is normal without pain or tenderness   Straight leg raising is normal  Strength & tone: normal   Stability overall normal stability  All other systems reviewed and are negative   The patient has been educated about the nature of the problem(s) and counseled on treatment options.  The patient appeared to understand what I have discussed and is in agreement with it.  Encounter Diagnoses  Name Primary?  . Chronic left-sided low back pain with left-sided sciatica Yes  . Nicotine dependence, cigarettes, uncomplicated     PLAN Call if any problems.  Precautions discussed.  Continue current medications.   Return to clinic 6 weeks   I refilled the Flexeril.  Electronically Signed Darreld Mclean, MD 8/12/202110:21 AM

## 2020-06-23 ENCOUNTER — Telehealth: Payer: Self-pay | Admitting: Physical Medicine and Rehabilitation

## 2020-06-23 NOTE — Telephone Encounter (Signed)
Patient's son Whitney Powell called requesting to set appt for back injection. Please call patient to set appt. Patient phone number is [phone]

## 2020-06-23 NOTE — Telephone Encounter (Signed)
Ok per [employer]

## 2020-06-23 NOTE — Telephone Encounter (Signed)
Patient had bilateral L4 TF on 7/28. Ok to repeat if helped, same problem/side, and no new injury?

## 2020-06-23 NOTE — Telephone Encounter (Signed)
Patient states that she has [facility] financial assistance. She states that this was approved recently. I do not see a copy of the letter in her chart. She will call them about this. I also advised that if she knows the dates of approval we can schedule her and she can bring a copy of the letter to her appointment to be scanned into her chart.

## 2020-06-30 NOTE — Procedures (Signed)
Lumbosacral Transforaminal Epidural Steroid Injection - Sub-Pedicular Approach with Fluoroscopic Guidance  Patient: Whitney Powell      Date of Birth: Sep 22, 1966 MRN: [id number] PCP: Jacquelin Hawking, PA-C      Visit Date: 05/27/2020   Universal Protocol:    Date/Time: 05/27/2020  Consent Given By: the patient  Position: PRONE  Additional Comments: Vital signs were monitored before and after the procedure. Patient was prepped and draped in the usual sterile fashion. The correct patient, procedure, and site was verified.   Injection Procedure Details:  Procedure Site One Meds Administered:  Meds ordered this encounter  Medications  . methylPREDNISolone acetate (DEPO-MEDROL) injection 80 mg    Laterality: Bilateral  Location/Site:  L4-L5  Needle size: 22 G  Needle type: Spinal  Needle Placement: Transforaminal  Findings:    -Comments: Excellent flow of contrast along the nerve, nerve root and into the epidural space.  Procedure Details: After squaring off the end-plates to get a true AP view, the C-arm was positioned so that an oblique view of the foramen as noted above was visualized. The target area is just inferior to the "nose of the scotty dog" or sub pedicular. The soft tissues overlying this structure were infiltrated with 2-3 ml. of 1% Lidocaine without Epinephrine.  The spinal needle was inserted toward the target using a "trajectory" view along the fluoroscope beam.  Under AP and lateral visualization, the needle was advanced so it did not puncture dura and was located close the 6 O'Clock position of the pedical in AP tracterory. Biplanar projections were used to confirm position. Aspiration was confirmed to be negative for CSF and/or blood. A 1-2 ml. volume of Isovue-250 was injected and flow of contrast was noted at each level. Radiographs were obtained for documentation purposes.   After attaining the desired flow of contrast documented above, a 0.5 to 1.0 ml  test dose of 0.25% Marcaine was injected into each respective transforaminal space.  The patient was observed for 90 seconds post injection.  After no sensory deficits were reported, and normal lower extremity motor function was noted,   the above injectate was administered so that equal amounts of the injectate were placed at each foramen (level) into the transforaminal epidural space.   Additional Comments:  The patient tolerated the procedure well Dressing: 2 x 2 sterile gauze and Band-Aid    Post-procedure details: Patient was observed during the procedure. Post-procedure instructions were reviewed.  Patient left the clinic in stable condition.

## 2020-07-15 NOTE — Telephone Encounter (Signed)
Patient has not called back, and I do not see a new financial assistance letter in her chart. Closing call.

## 2020-07-23 ENCOUNTER — Ambulatory Visit: Payer: Medicaid Other | Admitting: Orthopaedic Surgery

## 2020-08-14 ENCOUNTER — Telehealth: Payer: Self-pay

## 2020-08-14 NOTE — Telephone Encounter (Signed)
Patient called in to get sch with dr newton for another injection in back . Also asked about the pill she takes before , says its called valum?!?

## 2020-08-17 NOTE — Telephone Encounter (Signed)
Ok, and remind me on valium

## 2020-08-17 NOTE — Telephone Encounter (Signed)
Patient had bilateral L4 TF on 7/28. Ok to repeat if helped, same problem/side, and no new injury?

## 2020-08-18 NOTE — Telephone Encounter (Signed)
Try L3 tf esi, she has multilevel severe stenosis

## 2020-08-18 NOTE — Telephone Encounter (Signed)
Left message #1

## 2020-08-18 NOTE — Telephone Encounter (Signed)
Patient reports only about 30% relief. Please advise.

## 2020-08-19 NOTE — Telephone Encounter (Signed)
Pt has  Discount, she can be sch.

## 2020-08-19 NOTE — Telephone Encounter (Signed)
Called pt lvm #1.  

## 2020-08-20 ENCOUNTER — Telehealth: Payer: Self-pay | Admitting: Physical Medicine and Rehabilitation

## 2020-08-20 NOTE — Telephone Encounter (Signed)
Patient called. She would like to schedule with Dr. Alvester Morin. Her call back number is [phone]

## 2020-08-20 NOTE — Telephone Encounter (Signed)
Called pt and sch 11/17 

## 2020-08-25 ENCOUNTER — Other Ambulatory Visit: Payer: Self-pay | Admitting: Physical Medicine and Rehabilitation

## 2020-08-25 DIAGNOSIS — F411 Generalized anxiety disorder: Secondary | ICD-10-CM

## 2020-08-25 MED ORDER — DIAZEPAM 5 MG PO TABS: ORAL_TABLET | ORAL | 0 refills | Status: DC

## 2020-08-25 NOTE — Progress Notes (Signed)
Pre-procedure diazepam ordered for pre-operative anxiety.  

## 2020-08-26 IMAGING — DX DG LUMBAR SPINE COMPLETE 4+V
5 series · 5 of 5 positions shown · non-contrast
Comparison: November 13, 2018

CLINICAL DATA: Lower back pain.

EXAM:
LUMBAR SPINE - COMPLETE 4+ VIEW

[l-spine ap]
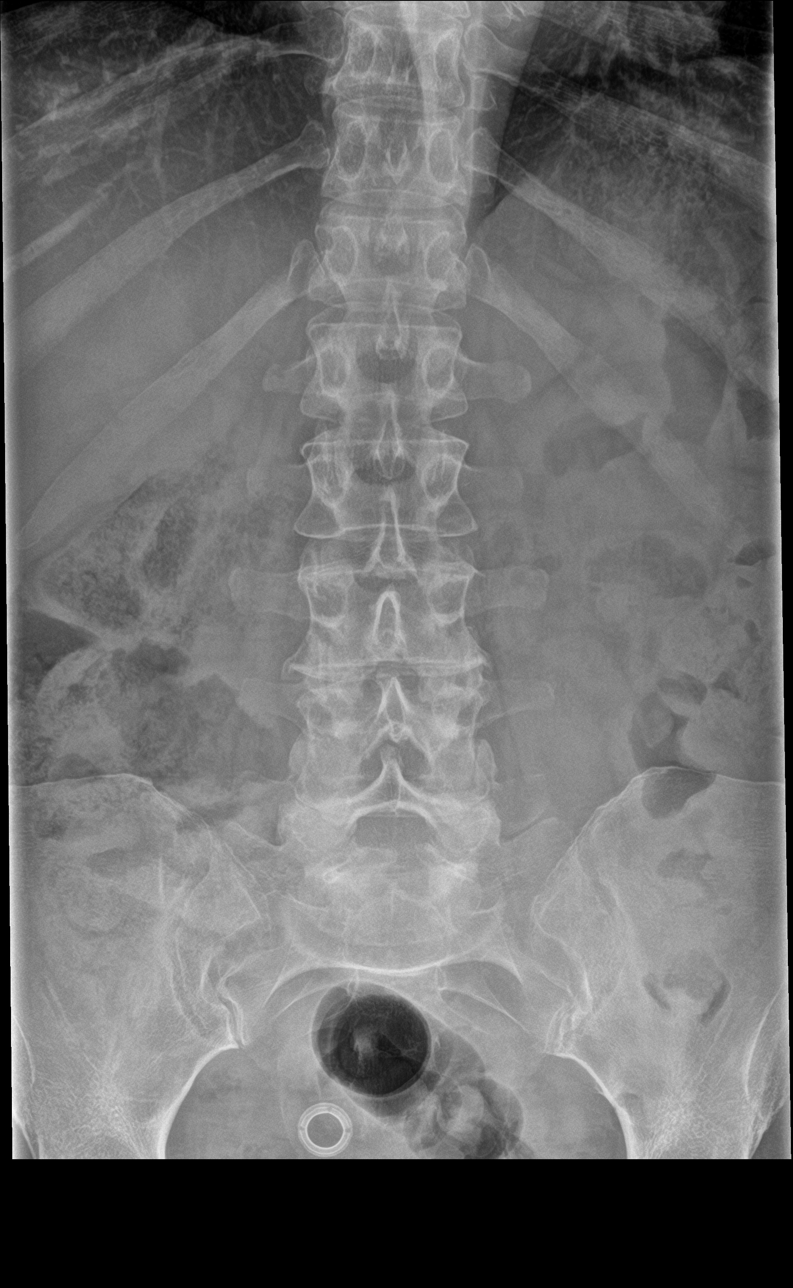

[l-spine obl (1 of 2)]
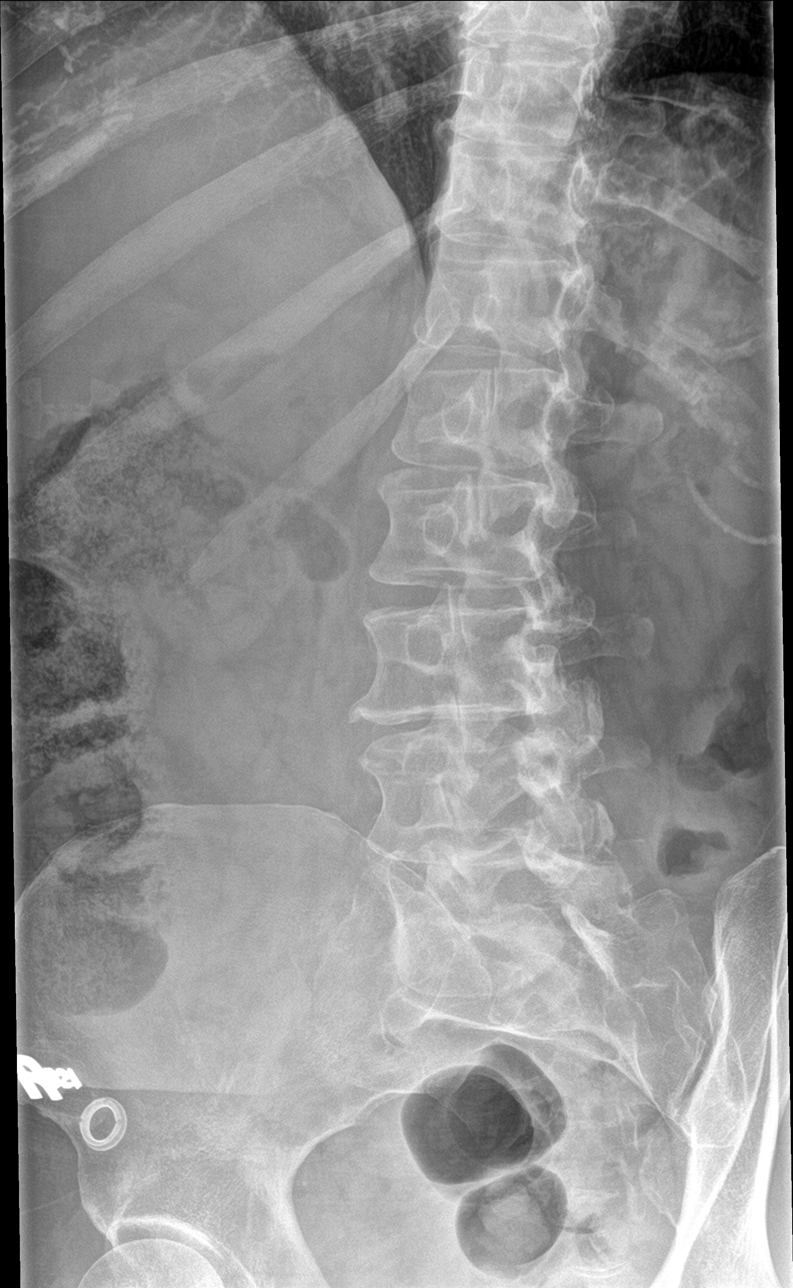

[l-spine obl (2 of 2)]
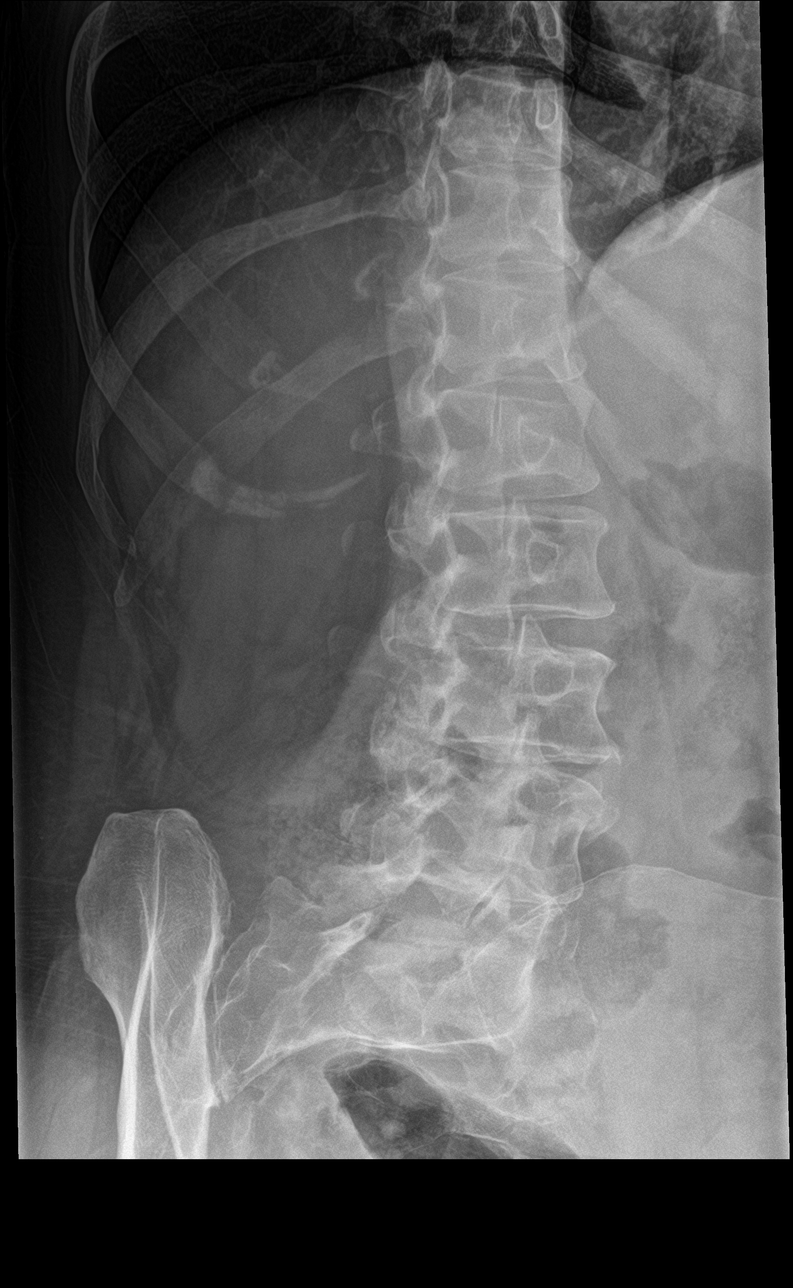

[l-spine lat]
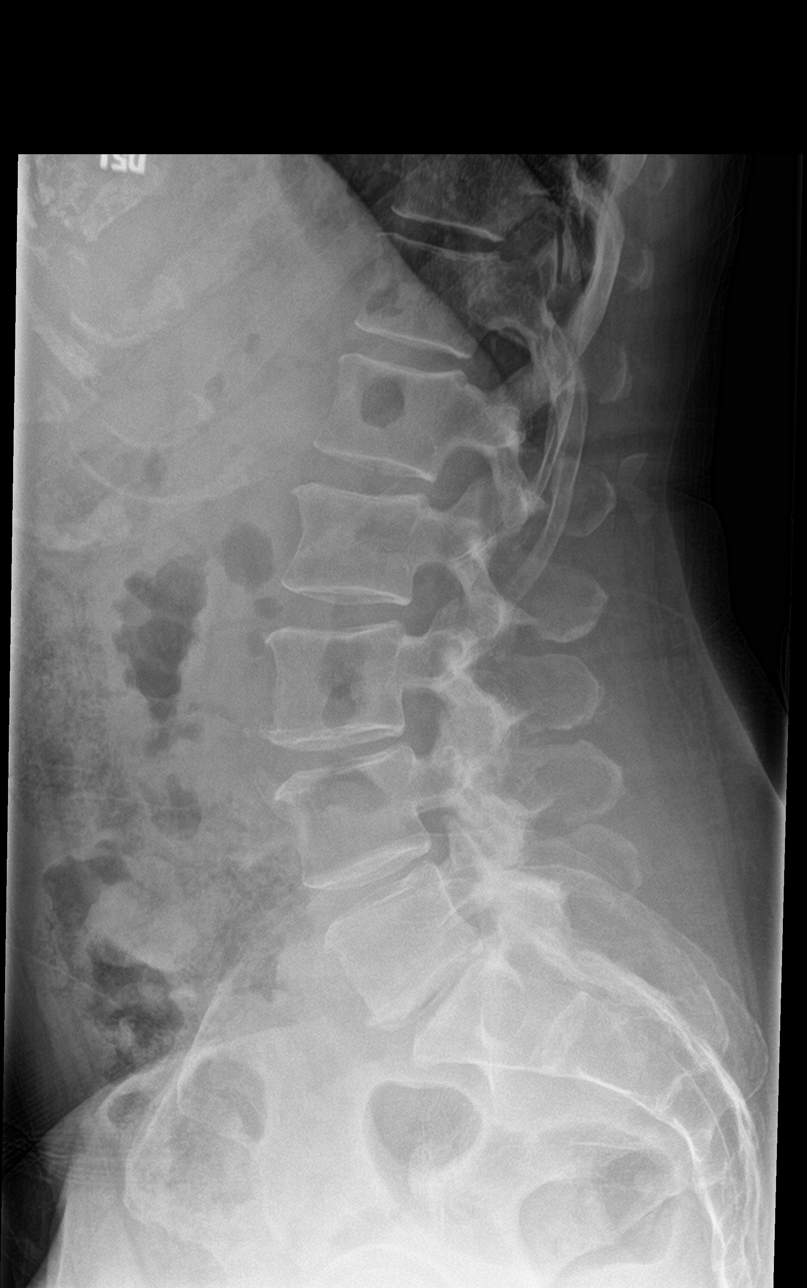

[l-spine spot]
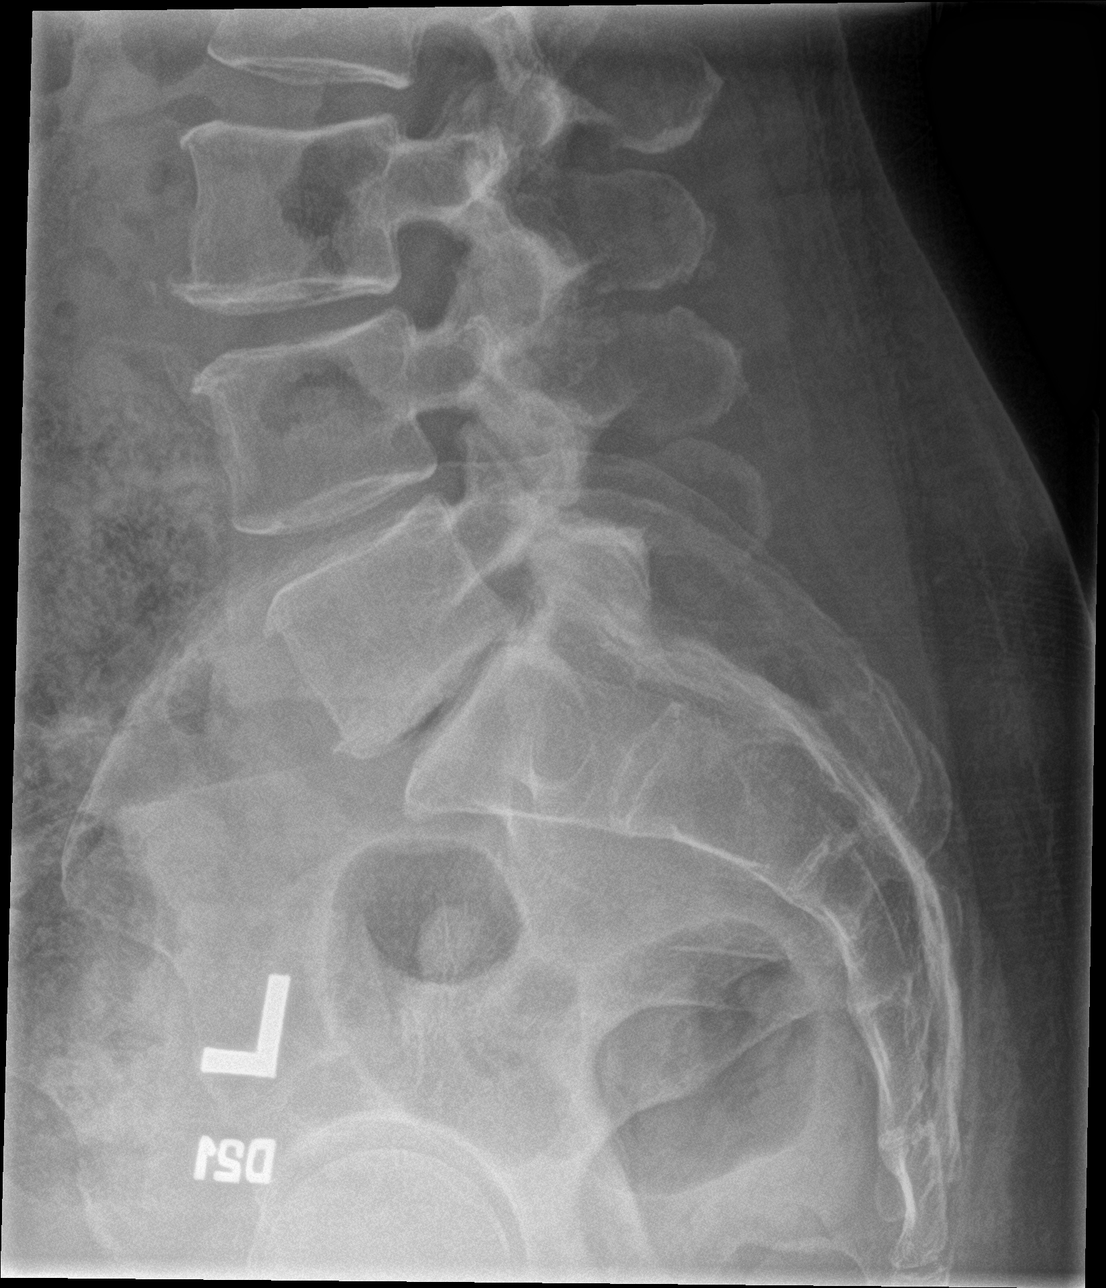

[5 of 5 positions shown; findings below may reference images not displayed]

FINDINGS: There is no evidence of lumbar spine fracture. Alignment is normal.
Mild endplate sclerosis is seen throughout the lumbar spine. Mild to
moderate severity intervertebral disc space narrowing is seen at the
level of L5-S1 with mild intervertebral disc space narrowing seen
throughout the remainder of the lumbar spine.
IMPRESSION: Multilevel degenerative changes, most prominent at the level of
L5-S1.

## 2020-09-09 ENCOUNTER — Telehealth: Payer: Self-pay | Admitting: Orthopaedic Surgery

## 2020-09-09 NOTE — Telephone Encounter (Signed)
Call received via voice message from patient relaying her appointment has been scheduled with Dr Alvester Morin; also asking about rescheduling follow up appointment with Dr Hilda Lias. Mentioned awaiting letter from [facility] finance office regarding discount.  Call returned - reached voice mail, left message to call back.

## 2020-09-16 ENCOUNTER — Telehealth: Payer: Self-pay

## 2020-09-16 ENCOUNTER — Ambulatory Visit: Payer: Medicaid Other | Admitting: Physical Medicine and Rehabilitation

## 2020-09-16 NOTE — Telephone Encounter (Signed)
Patient called in to cancel appt . Her ride canceled on her  [place] [facility] to give her a call to re sch

## 2020-09-16 NOTE — Telephone Encounter (Signed)
Rescheduled

## 2020-09-22 ENCOUNTER — Other Ambulatory Visit: Payer: Self-pay

## 2020-09-22 ENCOUNTER — Ambulatory Visit (INDEPENDENT_AMBULATORY_CARE_PROVIDER_SITE_OTHER): Payer: Self-pay | Admitting: Physical Medicine and Rehabilitation

## 2020-09-22 ENCOUNTER — Encounter: Payer: Self-pay | Admitting: Physical Medicine and Rehabilitation

## 2020-09-22 ENCOUNTER — Ambulatory Visit: Payer: Self-pay

## 2020-09-22 ENCOUNTER — Telehealth: Payer: Self-pay | Admitting: Orthopaedic Surgery

## 2020-09-22 VITALS — BP 95/68 | HR 71

## 2020-09-22 DIAGNOSIS — M48062 Spinal stenosis, lumbar region with neurogenic claudication: Secondary | ICD-10-CM

## 2020-09-22 DIAGNOSIS — M5416 Radiculopathy, lumbar region: Secondary | ICD-10-CM

## 2020-09-22 MED ORDER — METHYLPREDNISOLONE ACETATE 80 MG/ML IJ SUSP
80.0000 mg | Freq: Once | INTRAMUSCULAR | Status: AC
  Administered 2020-09-22: 80 mg

## 2020-09-22 NOTE — Procedures (Signed)
Lumbosacral Transforaminal Epidural Steroid Injection - Sub-Pedicular Approach with Fluoroscopic Guidance  Patient: Whitney Powell      Date of Birth: [birth year] MRN: [id number] PCP: Jacquelin Hawking, PA-C      Visit Date: 09/22/2020   Universal Protocol:    Date/Time: 09/22/2020  Consent Given By: the patient  Position: PRONE  Additional Comments: Vital signs were monitored before and after the procedure. Patient was prepped and draped in the usual sterile fashion. The correct patient, procedure, and site was verified.   Injection Procedure Details:   Procedure diagnoses:  1. Lumbar radiculopathy   2. Spinal stenosis of lumbar region with neurogenic claudication      Meds Administered:  Meds ordered this encounter  Medications  . methylPREDNISolone acetate (DEPO-MEDROL) injection 80 mg    Laterality: Bilateral  Location/Site:  L4-L5  Needle:5.0 in., 22 ga.  Short bevel or Quincke spinal needle  Needle Placement: Transforaminal  Findings:    -Comments: Excellent flow of contrast along the nerve, nerve root and into the epidural space.  Procedure Details: After squaring off the end-plates to get a true AP view, the C-arm was positioned so that an oblique view of the foramen as noted above was visualized. The target area is just inferior to the "nose of the scotty dog" or sub pedicular. The soft tissues overlying this structure were infiltrated with 2-3 ml. of 1% Lidocaine without Epinephrine.  The spinal needle was inserted toward the target using a "trajectory" view along the fluoroscope beam.  Under AP and lateral visualization, the needle was advanced so it did not puncture dura and was located close the 6 O'Clock position of the pedical in AP tracterory. Biplanar projections were used to confirm position. Aspiration was confirmed to be negative for CSF and/or blood. A 1-2 ml. volume of Isovue-250 was injected and flow of contrast was noted at each level. Radiographs  were obtained for documentation purposes.   After attaining the desired flow of contrast documented above, a 0.5 to 1.0 ml test dose of 0.25% Marcaine was injected into each respective transforaminal space.  The patient was observed for 90 seconds post injection.  After no sensory deficits were reported, and normal lower extremity motor function was noted,   the above injectate was administered so that equal amounts of the injectate were placed at each foramen (level) into the transforaminal epidural space.   Additional Comments:  The patient tolerated the procedure well Dressing: 2 x 2 sterile gauze and Band-Aid    Post-procedure details: Patient was observed during the procedure. Post-procedure instructions were reviewed.  Patient left the clinic in stable condition.

## 2020-09-22 NOTE — Progress Notes (Signed)
Pt state lower back pain that travels down to both legs. Pt state walking, standing for a long time make the pain worse. Pt state she takes pain meds to help ease the pain. Pt has hx of inj on 05/27/20 Pt state it was okay but she didn't know how long it lasted.  Numeric Pain Rating Scale and Functional Assessment Average Pain 8   In the last MONTH (on 0-10 scale) has pain interfered with the following?  1. General activity like being  able to carry out your everyday physical activities such as walking, climbing stairs, carrying groceries, or moving a chair?  Rating(10)   +Driver, -BT, -Dye Allergies.

## 2020-09-22 NOTE — Progress Notes (Signed)
Whitney Powell - 54 y.o. female MRN [id number]  Date of birth: 09/23/1966  Office Visit Note: Visit Date: 09/22/2020 PCP: Jacquelin Hawking, PA-C Referred by: Jacquelin Hawking, PA-C  Subjective: Chief Complaint  Patient presents with  . Lower Back - Pain  . Left Leg - Numbness  . Right Leg - Numbness   HPI:  Whitney Powell is a 54 y.o. female who comes in today for planned repeat Bilateral L4-L5 Lumbar epidural steroid injection with fluoroscopic guidance.  The patient has failed conservative care including home exercise, medications, time and activity modification.  This injection will be diagnostic and hopefully therapeutic.  Please see requesting physician notes for further details and justification. Patient received more than 50% pain relief from prior injection.   Referring: Dr. Darreld Mclean  May want to consider lumbar decompression.    ROS Otherwise per HPI.  Assessment & Plan: Visit Diagnoses:  1. Lumbar radiculopathy   2. Spinal stenosis of lumbar region with neurogenic claudication     Plan: No additional findings.   Meds & Orders:  Meds ordered this encounter  Medications  . methylPREDNISolone acetate (DEPO-MEDROL) injection 80 mg    Orders Placed This Encounter  Procedures  . XR C-ARM NO REPORT  . Epidural Steroid injection    Follow-up: No follow-ups on file.   Procedures: No procedures performed  Lumbosacral Transforaminal Epidural Steroid Injection - Sub-Pedicular Approach with Fluoroscopic Guidance  Patient: Whitney Powell      Date of Birth: [birth year] MRN: [id number] PCP: Jacquelin Hawking, PA-C      Visit Date: 09/22/2020   Universal Protocol:    Date/Time: 09/22/2020  Consent Given By: the patient  Position: PRONE  Additional Comments: Vital signs were monitored before and after the procedure. Patient was prepped and draped in the usual sterile fashion. The correct patient, procedure, and site was verified.   Injection Procedure Details:     Procedure diagnoses:  1. Lumbar radiculopathy   2. Spinal stenosis of lumbar region with neurogenic claudication      Meds Administered:  Meds ordered this encounter  Medications  . methylPREDNISolone acetate (DEPO-MEDROL) injection 80 mg    Laterality: Bilateral  Location/Site:  L4-L5  Needle:5.0 in., 22 ga.  Short bevel or Quincke spinal needle  Needle Placement: Transforaminal  Findings:    -Comments: Excellent flow of contrast along the nerve, nerve root and into the epidural space.  Procedure Details: After squaring off the end-plates to get a true AP view, the C-arm was positioned so that an oblique view of the foramen as noted above was visualized. The target area is just inferior to the "nose of the scotty dog" or sub pedicular. The soft tissues overlying this structure were infiltrated with 2-3 ml. of 1% Lidocaine without Epinephrine.  The spinal needle was inserted toward the target using a "trajectory" view along the fluoroscope beam.  Under AP and lateral visualization, the needle was advanced so it did not puncture dura and was located close the 6 O'Clock position of the pedical in AP tracterory. Biplanar projections were used to confirm position. Aspiration was confirmed to be negative for CSF and/or blood. A 1-2 ml. volume of Isovue-250 was injected and flow of contrast was noted at each level. Radiographs were obtained for documentation purposes.   After attaining the desired flow of contrast documented above, a 0.5 to 1.0 ml test dose of 0.25% Marcaine was injected into each respective transforaminal space.  The patient was observed for 90 seconds post injection.  After no sensory deficits were reported, and normal lower extremity motor function was noted,   the above injectate was administered so that equal amounts of the injectate were placed at each foramen (level) into the transforaminal epidural space.   Additional Comments:  The patient tolerated the  procedure well Dressing: 2 x 2 sterile gauze and Band-Aid    Post-procedure details: Patient was observed during the procedure. Post-procedure instructions were reviewed.  Patient left the clinic in stable condition.      Clinical History: CLINICAL DATA:  Low back pain, left-sided low back pain with left-sided sciatica  EXAM: MRI LUMBAR SPINE WITHOUT CONTRAST  TECHNIQUE: Multiplanar, multisequence MR imaging of the lumbar spine was performed. No intravenous contrast was administered.  COMPARISON:  None.  FINDINGS: Segmentation:  Standard.  Alignment:  Physiologic.  Vertebrae:  No fracture, evidence of discitis, or bone lesion.  Conus medullaris and cauda equina: Conus extends to the T12-L1 level. Conus and cauda equina appear normal.  Paraspinal and other soft tissues: No acute paraspinal abnormality.  Disc levels:  Disc spaces: Degenerative disease with disc height loss at L3-4 and L5-S1.  T12-L1: No significant disc bulge. No evidence of neural foraminal stenosis. No central canal stenosis.  L1-L2: No significant disc bulge. No evidence of neural foraminal stenosis. No central canal stenosis. Mild bilateral facet arthropathy.  L2-L3: Mild broad-based disc bulge. Mild bilateral facet arthropathy. No evidence of neural foraminal stenosis. No central canal stenosis.  L3-L4: Large broad-based disc bulge with a small central disc protrusion. Severe bilateral facet arthropathy with ligamentum flavum infolding. Severe spinal stenosis. Moderate bilateral foraminal stenosis.  L4-L5: Broad-based disc bulge. Severe bilateral facet arthropathy. Severe spinal stenosis. Moderate bilateral foraminal stenosis.  L5-S1: Broad-based disc bulge with a small central disc protrusion. Moderate bilateral facet arthropathy. Moderate bilateral foraminal stenosis. No central canal stenosis. Bilateral subarticular recess narrowing.  IMPRESSION: 1. At L3-4  there is a large broad-based disc bulge with a small central disc protrusion. Severe bilateral facet arthropathy with ligamentum flavum infolding. Severe spinal stenosis. Moderate bilateral foraminal stenosis. 2. At L4-5 there is a broad-based disc bulge. Severe bilateral facet arthropathy. Severe spinal stenosis. Moderate bilateral foraminal stenosis. 3. At L5-S1 there is a broad-based disc bulge with a small central disc protrusion. Moderate bilateral facet arthropathy. Moderate bilateral foraminal stenosis. Bilateral subarticular recess narrowing.   Electronically Signed   By: Elige Ko   On: 03/29/2020 15:56     Objective:  VS:  HT:    WT:   BMI:     BP:95/68  HR:71bpm  TEMP: ( )  RESP:  Physical Exam Constitutional:      General: She is not in acute distress.    Appearance: Normal appearance. She is not ill-appearing.  HENT:     Head: Normocephalic and atraumatic.     Right Ear: External ear normal.     Left Ear: External ear normal.  Eyes:     Extraocular Movements: Extraocular movements intact.  Cardiovascular:     Rate and Rhythm: Normal rate.     Pulses: Normal pulses.  Musculoskeletal:     Right lower leg: No edema.     Left lower leg: No edema.     Comments: Patient has good distal strength with no pain over the greater trochanters.  No clonus or focal weakness.  Skin:    Findings: No erythema, lesion or rash.  Neurological:     General: No focal deficit present.     Mental Status: She  is alert and oriented to person, place, and time.     Sensory: No sensory deficit.     Motor: No weakness or abnormal muscle tone.     Coordination: Coordination normal.  Psychiatric:        Mood and Affect: Mood normal.        Behavior: Behavior normal.      Imaging: No results found.

## 2020-09-22 NOTE — Telephone Encounter (Signed)
Patient relays she is aware that she is to follow up with Dr Hilda Lias for results of nerve conduction study. She states she was sent by her disability doctor to Dr [facility], and that she has it done there. States she will contact their office to request the results and will schedule her appointment once we have the results.

## 2020-10-05 ENCOUNTER — Telehealth: Payer: Self-pay | Admitting: Orthopaedic Surgery

## 2020-10-05 NOTE — Telephone Encounter (Signed)
Patient requests refill on Flexeril 5 mgs.  Qty  45  Sig: Take 1 tablet (5 mg total) by mouth 3 (three) times daily as needed.  Patient states she uses [facility] in [place]

## 2020-10-06 MED ORDER — CYCLOBENZAPRINE HCL 5 MG PO TABS: 5.0000 mg | ORAL_TABLET | Freq: Three times a day (TID) | ORAL | 0 refills | Status: DC | PRN

## 2020-11-10 ENCOUNTER — Ambulatory Visit (INDEPENDENT_AMBULATORY_CARE_PROVIDER_SITE_OTHER): Payer: Self-pay | Admitting: Orthopaedic Surgery

## 2020-11-10 ENCOUNTER — Other Ambulatory Visit: Payer: Self-pay

## 2020-11-10 ENCOUNTER — Encounter: Payer: Self-pay | Admitting: Orthopaedic Surgery

## 2020-11-10 ENCOUNTER — Telehealth: Payer: Self-pay | Admitting: Physical Medicine and Rehabilitation

## 2020-11-10 VITALS — BP 88/44 | HR 84 | Ht 65.0 in | Wt 175.0 lb

## 2020-11-10 DIAGNOSIS — M5442 Lumbago with sciatica, left side: Secondary | ICD-10-CM

## 2020-11-10 DIAGNOSIS — F17210 Nicotine dependence, cigarettes, uncomplicated: Secondary | ICD-10-CM

## 2020-11-10 DIAGNOSIS — G8929 Other chronic pain: Secondary | ICD-10-CM

## 2020-11-10 NOTE — Telephone Encounter (Signed)
Called pt and LVM #1 

## 2020-11-10 NOTE — Telephone Encounter (Signed)
Ok for bilat L3 tf esi, see about valium, she did see Dr. Hilda Lias today

## 2020-11-10 NOTE — Telephone Encounter (Signed)
Patient would like an appointment with Dr. Alvester Morin. Her call back number is [phone]

## 2020-11-10 NOTE — Telephone Encounter (Signed)
Patient's last injection was Bil L4-L5 TF on 09/22/20. Ok to repeat if helped, same problem/side, and no new injury?

## 2020-11-10 NOTE — Progress Notes (Signed)
Patient Whitney Powell, female DOB:Nov 12, 1965, 55 y.o. GYB:[id number]  Chief Complaint  Patient presents with  . Back Pain    Last injection did not help     HPI  Whitney Powell is a 55 y.o. female who has continued lower back pain.  She saw Dr. Alvester Morin in November and had second epidural which did not help that much. She is followed by pain clinic and they are giving pain medicines.  She will need to see if Dr. Alvester Morin will consider a third epidural.  She will need to contact pain clinic for pain medicine.     Body mass index is 29.12 kg/m.  ROS  Review of Systems  Constitutional: Positive for activity change.  Respiratory: Positive for shortness of breath.   Musculoskeletal: Positive for arthralgias and back pain.  Psychiatric/Behavioral: The patient is nervous/anxious.   All other systems reviewed and are negative.   All other systems reviewed and are negative.  The following is a summary of the past history medically, past history surgically, known current medicines, social history and family history.  This information is gathered electronically by the computer from prior information and documentation.  I review this each visit and have found including this information at this point in the chart is beneficial and informative.    Past Medical History:  Diagnosis Date  . Anxiety   . Bronchitis   . Depression   . GERD (gastroesophageal reflux disease)   . Neuromuscular disorder (HCC)    bilat carpal tunnel synd  . Tobacco abuse 12/12/2016    Past Surgical History:  Procedure Laterality Date  . TUBAL LIGATION  1990    Family History  Problem Relation Age of Onset  . Heart disease Mother   . Hyperlipidemia Mother   . Hypertension Mother   . Stroke Mother   . Cancer Father        pancrease  . Diabetes Father   . Hearing loss Father   . Cancer Sister        Corky Mull  . Hepatitis C Sister   . Cancer Paternal Grandmother        pancreatic cancer    Social  History Social History   Tobacco Use  . Smoking status: Current Every Day Smoker    Packs/day: 0.50    Years: 46.00    Pack years: 23.00    Types: Cigarettes    Start date: 10/31/1980  . Smokeless tobacco: Never Used  . Tobacco comment: discussed  Vaping Use  . Vaping Use: Former  Substance Use Topics  . Alcohol use: Yes    Alcohol/week: 1.0 standard drink    Types: 1 Cans of beer per week    Comment: occassional  . Drug use: No    No Known Allergies  Current Outpatient Medications  Medication Sig Dispense Refill  . cyclobenzaprine (FLEXERIL) 5 MG tablet Take 1 tablet (5 mg total) by mouth 3 (three) times daily as needed. 45 tablet 0  . diazepam (VALIUM) 5 MG tablet Take 1 by mouth 1 hour  pre-procedure with very light food. May bring 2nd tablet to appointment. (Patient not taking: Reported on 11/10/2020) 2 tablet 0   No current facility-administered medications for this visit.     Physical Exam  Blood pressure (!) 88/44, pulse 84, height 5\' 5"  (1.651 m), weight 175 lb (79.4 kg), last menstrual period 12/12/2010.  Constitutional: overall normal hygiene, normal nutrition, well developed, normal grooming, normal body habitus. Assistive device:cane  Musculoskeletal: gait and station  Limp none, muscle tone and strength are normal, no tremors or atrophy is present.  .  Neurological: coordination overall normal.  Deep tendon reflex/nerve stretch intact.  Sensation normal.  Cranial nerves II-XII intact.   Skin:   Normal overall no scars, lesions, ulcers or rashes. No psoriasis.  Psychiatric: Alert and oriented x 3.  Recent memory intact, remote memory unclear.  Normal mood and affect. Well groomed.  Good eye contact.  Cardiovascular: overall no swelling, no varicosities, no edema bilaterally, normal temperatures of the legs and arms, no clubbing, cyanosis and good capillary refill.  Lymphatic: palpation is normal.  Spine/Pelvis examination:  Inspection:  Overall, sacoiliac  joint benign and hips nontender; without crepitus or defects.   Thoracic spine inspection: Alignment normal without kyphosis present   Lumbar spine inspection:  Alignment  with normal lumbar lordosis, without scoliosis apparent.   Thoracic spine palpation:  without tenderness of spinal processes   Lumbar spine palpation: without tenderness of lumbar area; without tightness of lumbar muscles    Range of Motion:   Lumbar flexion, forward flexion is normal without pain or tenderness    Lumbar extension is full without pain or tenderness   Left lateral bend is normal without pain or tenderness   Right lateral bend is normal without pain or tenderness   Straight leg raising is normal  Strength & tone: normal   Stability overall normal stability  All other systems reviewed and are negative   The patient has been educated about the nature of the problem(s) and counseled on treatment options.  The patient appeared to understand what I have discussed and is in agreement with it.  Encounter Diagnoses  Name Primary?  . Chronic left-sided low back pain with left-sided sciatica Yes  . Nicotine dependence, cigarettes, uncomplicated     PLAN Call if any problems.  Precautions discussed.  Continue current medications.   Return to clinic 1 month   Contact Dr. Gruver Blas office and pain clinic office.  Electronically Signed Darreld Mclean, MD 1/11/202210:26 AM

## 2020-11-10 NOTE — Progress Notes (Signed)
b

## 2020-11-10 NOTE — Patient Instructions (Signed)
Steps to Quit Smoking Smoking tobacco is the leading cause of preventable death. It can affect almost every organ in the body. Smoking puts you and people around you at risk for many serious, long-lasting (chronic) diseases. Quitting smoking can be hard, but it is one of the best things that you can do for your health. It is never too late to quit. How do I get ready to quit? When you decide to quit smoking, make a plan to help you succeed. Before you quit:  Pick a date to quit. Set a date within the next 2 weeks to give you time to prepare.  Write down the reasons why you are quitting. Keep this list in places where you will see it often.  Tell your family, friends, and co-workers that you are quitting. Their support is important.  Talk with your doctor about the choices that may help you quit.  Find out if your health insurance will pay for these treatments.  Know the people, places, things, and activities that make you want to smoke (triggers). Avoid them. What first steps can I take to quit smoking?  Throw away all cigarettes at home, at work, and in your car.  Throw away the things that you use when you smoke, such as ashtrays and lighters.  Clean your car. Make sure to empty the ashtray.  Clean your home, including curtains and carpets. What can I do to help me quit smoking? Talk with your doctor about taking medicines and seeing a counselor at the same time. You are more likely to succeed when you do both.  If you are pregnant or breastfeeding, talk with your doctor about counseling or other ways to quit smoking. Do not take medicine to help you quit smoking unless your doctor tells you to do so. To quit smoking: Quit right away  Quit smoking totally, instead of slowly cutting back on how much you smoke over a period of time.  Go to counseling. You are more likely to quit if you go to counseling sessions regularly. Take medicine You may take medicines to help you quit. Some  medicines need a prescription, and some you can buy over-the-counter. Some medicines may contain a drug called nicotine to replace the nicotine in cigarettes. Medicines may:  Help you to stop having the desire to smoke (cravings).  Help to stop the problems that come when you stop smoking (withdrawal symptoms). Your doctor may ask you to use:  Nicotine patches, gum, or lozenges.  Nicotine inhalers or sprays.  Non-nicotine medicine that is taken by mouth. Find resources Find resources and other ways to help you quit smoking and remain smoke-free after you quit. These resources are most helpful when you use them often. They include:  Online chats with a counselor.  Phone quitlines.  Printed self-help materials.  Support groups or group counseling.  Text messaging programs.  Mobile phone apps. Use apps on your mobile phone or tablet that can help you stick to your quit plan. There are many free apps for mobile phones and tablets as well as websites. Examples include Quit Guide from the CDC and smokefree.gov   What things can I do to make it easier to quit?  Talk to your family and friends. Ask them to support and encourage you.  Call a phone quitline (1-800-QUIT-NOW), reach out to support groups, or work with a counselor.  Ask people who smoke to not smoke around you.  Avoid places that make you want to smoke,   such as: ? Bars. ? Parties. ? Smoke-break areas at work.  Spend time with people who do not smoke.  Lower the stress in your life. Stress can make you want to smoke. Try these things to help your stress: ? Getting regular exercise. ? Doing deep-breathing exercises. ? Doing yoga. ? Meditating. ? Doing a body scan. To do this, close your eyes, focus on one area of your body at a time from head to toe. Notice which parts of your body are tense. Try to relax the muscles in those areas.   How will I feel when I quit smoking? Day 1 to 3 weeks Within the first 24 hours,  you may start to have some problems that come from quitting tobacco. These problems are very bad 2-3 days after you quit, but they do not often last for more than 2-3 weeks. You may get these symptoms:  Mood swings.  Feeling restless, nervous, angry, or annoyed.  Trouble concentrating.  Dizziness.  Strong desire for high-sugar foods and nicotine.  Weight gain.  Trouble pooping (constipation).  Feeling like you may vomit (nausea).  Coughing or a sore throat.  Changes in how the medicines that you take for other issues work in your body.  Depression.  Trouble sleeping (insomnia). Week 3 and afterward After the first 2-3 weeks of quitting, you may start to notice more positive results, such as:  Better sense of smell and taste.  Less coughing and sore throat.  Slower heart rate.  Lower blood pressure.  Clearer skin.  Better breathing.  Fewer sick days. Quitting smoking can be hard. Do not give up if you fail the first time. Some people need to try a few times before they succeed. Do your best to stick to your quit plan, and talk with your doctor if you have any questions or concerns. Summary  Smoking tobacco is the leading cause of preventable death. Quitting smoking can be hard, but it is one of the best things that you can do for your health.  When you decide to quit smoking, make a plan to help you succeed.  Quit smoking right away, not slowly over a period of time.  When you start quitting, seek help from your doctor, family, or friends. This information is not intended to replace advice given to you by your health care provider. Make sure you discuss any questions you have with your health care provider. Document Revised: 07/12/2019 Document Reviewed: 01/05/2019 Elsevier Patient Education  2021 [employer]vier Inc.  

## 2020-11-11 ENCOUNTER — Telehealth: Payer: Self-pay

## 2020-11-11 NOTE — Telephone Encounter (Signed)
Called pt and sch 2/3

## 2020-11-11 NOTE — Telephone Encounter (Signed)
Pt requested Valium for her appt on 2/3

## 2020-11-13 ENCOUNTER — Other Ambulatory Visit: Payer: Self-pay | Admitting: Physical Medicine and Rehabilitation

## 2020-11-13 DIAGNOSIS — F411 Generalized anxiety disorder: Secondary | ICD-10-CM

## 2020-11-13 MED ORDER — DIAZEPAM 5 MG PO TABS: ORAL_TABLET | ORAL | 0 refills | Status: DC

## 2020-11-13 NOTE — Telephone Encounter (Signed)
done

## 2020-11-13 NOTE — Progress Notes (Signed)
Pre-procedure diazepam ordered for pre-operative anxiety.  

## 2020-11-23 ENCOUNTER — Telehealth: Payer: Self-pay | Admitting: Orthopaedic Surgery

## 2020-11-24 NOTE — Telephone Encounter (Signed)
Med refill

## 2020-12-03 ENCOUNTER — Encounter: Payer: Self-pay | Admitting: Physical Medicine and Rehabilitation

## 2020-12-03 ENCOUNTER — Ambulatory Visit: Payer: Self-pay

## 2020-12-03 ENCOUNTER — Other Ambulatory Visit: Payer: Self-pay

## 2020-12-03 ENCOUNTER — Ambulatory Visit (INDEPENDENT_AMBULATORY_CARE_PROVIDER_SITE_OTHER): Payer: Self-pay | Admitting: Physical Medicine and Rehabilitation

## 2020-12-03 VITALS — BP 94/65 | HR 92

## 2020-12-03 DIAGNOSIS — M48062 Spinal stenosis, lumbar region with neurogenic claudication: Secondary | ICD-10-CM

## 2020-12-03 DIAGNOSIS — M5416 Radiculopathy, lumbar region: Secondary | ICD-10-CM

## 2020-12-03 MED ORDER — DEXAMETHASONE SODIUM PHOSPHATE 10 MG/ML IJ SOLN
15.0000 mg | Freq: Once | INTRAMUSCULAR | Status: AC
Start: 2020-12-03 — End: 2020-12-03
  Administered 2020-12-03: 15 mg

## 2020-12-03 NOTE — Procedures (Signed)
Lumbosacral Transforaminal Epidural Steroid Injection - Sub-Pedicular Approach with Fluoroscopic Guidance  Patient: Whitney Powell      Date of Birth: 16-Feb-1966 MRN: [id number] PCP: Jacquelin Hawking, PA-C      Visit Date: 12/03/2020   Universal Protocol:    Date/Time: 12/03/2020  Consent Given By: the patient  Position: PRONE  Additional Comments: Vital signs were monitored before and after the procedure. Patient was prepped and draped in the usual sterile fashion. The correct patient, procedure, and site was verified.   Injection Procedure Details:   Procedure diagnoses: Lumbar radiculopathy [M54.16]    Meds Administered:  Meds ordered this encounter  Medications  . dexamethasone (DECADRON) injection 15 mg    Laterality: Bilateral  Location/Site:  L3-L4  Needle:5.0 in., 22 ga.  Short bevel or Quincke spinal needle  Needle Placement: Transforaminal  Findings:    -Comments: Excellent flow of contrast along the nerve, nerve root and into the epidural space.  Procedure Details: After squaring off the end-plates to get a true AP view, the C-arm was positioned so that an oblique view of the foramen as noted above was visualized. The target area is just inferior to the "nose of the scotty dog" or sub pedicular. The soft tissues overlying this structure were infiltrated with 2-3 ml. of 1% Lidocaine without Epinephrine.  The spinal needle was inserted toward the target using a "trajectory" view along the fluoroscope beam.  Under AP and lateral visualization, the needle was advanced so it did not puncture dura and was located close the 6 O'Clock position of the pedical in AP tracterory. Biplanar projections were used to confirm position. Aspiration was confirmed to be negative for CSF and/or blood. A 1-2 ml. volume of Isovue-250 was injected and flow of contrast was noted at each level. Radiographs were obtained for documentation purposes.   After attaining the desired flow of  contrast documented above, a 0.5 to 1.0 ml test dose of 0.25% Marcaine was injected into each respective transforaminal space.  The patient was observed for 90 seconds post injection.  After no sensory deficits were reported, and normal lower extremity motor function was noted,   the above injectate was administered so that equal amounts of the injectate were placed at each foramen (level) into the transforaminal epidural space.   Additional Comments:  The patient tolerated the procedure well Dressing: 2 x 2 sterile gauze and Band-Aid    Post-procedure details: Patient was observed during the procedure. Post-procedure instructions were reviewed.  Patient left the clinic in stable condition.

## 2020-12-03 NOTE — Progress Notes (Signed)
Whitney Powell - 55 y.o. female MRN [id number]  Date of birth: 1966/04/26  Office Visit Note: Visit Date: 12/03/2020 PCP: Jacquelin Hawking, PA-C Referred by: Jacquelin Hawking, PA-C  Subjective: Chief Complaint  Patient presents with  . Lower Back - Pain   HPI:  Whitney Powell is a 55 y.o. female who comes in today at the request of Dr. Darreld Mclean for planned Bilateral L3-L4 Lumbar epidural steroid injection with fluoroscopic guidance.  The patient has failed conservative care including home exercise, medications, time and activity modification.  This injection will be diagnostic and hopefully therapeutic.  Please see requesting physician notes for further details and  justification.  MRI reviewed with images and spine model.  MRI reviewed in the note below.  Prior L4 injection helped 50% in July of 2021 but subsequent injection was not as helpful. Depending on relief, follow up with Dr. Hilda Lias and they may wish to consult with Dr. Otelia Sergeant, orthopedic spine, or neurosurgery.   ROS Otherwise per HPI.  Assessment & Plan: Visit Diagnoses:    ICD-10-CM   1. Lumbar radiculopathy  M54.16 XR C-ARM NO REPORT    Epidural Steroid injection    dexamethasone (DECADRON) injection 15 mg  2. Spinal stenosis of lumbar region with neurogenic claudication  M48.062 XR C-ARM NO REPORT    Epidural Steroid injection    dexamethasone (DECADRON) injection 15 mg    Plan: No additional findings.   Meds & Orders:  Meds ordered this encounter  Medications  . dexamethasone (DECADRON) injection 15 mg    Orders Placed This Encounter  Procedures  . XR C-ARM NO REPORT  . Epidural Steroid injection    Follow-up: Return for visit to requesting physician as needed, Darreld Mclean, MD.   Procedures: No procedures performed  Lumbosacral Transforaminal Epidural Steroid Injection - Sub-Pedicular Approach with Fluoroscopic Guidance  Patient: Whitney Powell      Date of Birth: 1966/05/04 MRN: [id number] PCP:  Jacquelin Hawking, PA-C      Visit Date: 12/03/2020   Universal Protocol:    Date/Time: 12/03/2020  Consent Given By: the patient  Position: PRONE  Additional Comments: Vital signs were monitored before and after the procedure. Patient was prepped and draped in the usual sterile fashion. The correct patient, procedure, and site was verified.   Injection Procedure Details:   Procedure diagnoses: Lumbar radiculopathy [M54.16]    Meds Administered:  Meds ordered this encounter  Medications  . dexamethasone (DECADRON) injection 15 mg    Laterality: Bilateral  Location/Site:  L3-L4  Needle:5.0 in., 22 ga.  Short bevel or Quincke spinal needle  Needle Placement: Transforaminal  Findings:    -Comments: Excellent flow of contrast along the nerve, nerve root and into the epidural space.  Procedure Details: After squaring off the end-plates to get a true AP view, the C-arm was positioned so that an oblique view of the foramen as noted above was visualized. The target area is just inferior to the "nose of the scotty dog" or sub pedicular. The soft tissues overlying this structure were infiltrated with 2-3 ml. of 1% Lidocaine without Epinephrine.  The spinal needle was inserted toward the target using a "trajectory" view along the fluoroscope beam.  Under AP and lateral visualization, the needle was advanced so it did not puncture dura and was located close the 6 O'Clock position of the pedical in AP tracterory. Biplanar projections were used to confirm position. Aspiration was confirmed to be negative for CSF and/or blood. A 1-2 ml. volume of  Isovue-250 was injected and flow of contrast was noted at each level. Radiographs were obtained for documentation purposes.   After attaining the desired flow of contrast documented above, a 0.5 to 1.0 ml test dose of 0.25% Marcaine was injected into each respective transforaminal space.  The patient was observed for 90 seconds post injection.   After no sensory deficits were reported, and normal lower extremity motor function was noted,   the above injectate was administered so that equal amounts of the injectate were placed at each foramen (level) into the transforaminal epidural space.   Additional Comments:  The patient tolerated the procedure well Dressing: 2 x 2 sterile gauze and Band-Aid    Post-procedure details: Patient was observed during the procedure. Post-procedure instructions were reviewed.  Patient left the clinic in stable condition.      Clinical History: CLINICAL DATA:  Low back pain, left-sided low back pain with left-sided sciatica  EXAM: MRI LUMBAR SPINE WITHOUT CONTRAST  TECHNIQUE: Multiplanar, multisequence MR imaging of the lumbar spine was performed. No intravenous contrast was administered.  COMPARISON:  None.  FINDINGS: Segmentation:  Standard.  Alignment:  Physiologic.  Vertebrae:  No fracture, evidence of discitis, or bone lesion.  Conus medullaris and cauda equina: Conus extends to the T12-L1 level. Conus and cauda equina appear normal.  Paraspinal and other soft tissues: No acute paraspinal abnormality.  Disc levels:  Disc spaces: Degenerative disease with disc height loss at L3-4 and L5-S1.  T12-L1: No significant disc bulge. No evidence of neural foraminal stenosis. No central canal stenosis.  L1-L2: No significant disc bulge. No evidence of neural foraminal stenosis. No central canal stenosis. Mild bilateral facet arthropathy.  L2-L3: Mild broad-based disc bulge. Mild bilateral facet arthropathy. No evidence of neural foraminal stenosis. No central canal stenosis.  L3-L4: Large broad-based disc bulge with a small central disc protrusion. Severe bilateral facet arthropathy with ligamentum flavum infolding. Severe spinal stenosis. Moderate bilateral foraminal stenosis.  L4-L5: Broad-based disc bulge. Severe bilateral facet arthropathy. Severe  spinal stenosis. Moderate bilateral foraminal stenosis.  L5-S1: Broad-based disc bulge with a small central disc protrusion. Moderate bilateral facet arthropathy. Moderate bilateral foraminal stenosis. No central canal stenosis. Bilateral subarticular recess narrowing.  IMPRESSION: 1. At L3-4 there is a large broad-based disc bulge with a small central disc protrusion. Severe bilateral facet arthropathy with ligamentum flavum infolding. Severe spinal stenosis. Moderate bilateral foraminal stenosis. 2. At L4-5 there is a broad-based disc bulge. Severe bilateral facet arthropathy. Severe spinal stenosis. Moderate bilateral foraminal stenosis. 3. At L5-S1 there is a broad-based disc bulge with a small central disc protrusion. Moderate bilateral facet arthropathy. Moderate bilateral foraminal stenosis. Bilateral subarticular recess narrowing.   Electronically Signed   By: Elige Ko   On: 03/29/2020 15:56     Objective:  VS:  HT:    WT:   BMI:     BP:94/65  HR:92bpm  TEMP: ( )  RESP:  Physical Exam Vitals and nursing note reviewed.  Constitutional:      General: She is not in acute distress.    Appearance: Normal appearance. She is not ill-appearing.  HENT:     Head: Normocephalic and atraumatic.     Right Ear: External ear normal.     Left Ear: External ear normal.  Eyes:     Extraocular Movements: Extraocular movements intact.  Cardiovascular:     Rate and Rhythm: Normal rate.     Pulses: Normal pulses.  Pulmonary:     Effort: Pulmonary effort  is normal. No respiratory distress.  Abdominal:     General: There is no distension.     Palpations: Abdomen is soft.  Musculoskeletal:        General: Tenderness present.     Cervical back: Neck supple.     Right lower leg: No edema.     Left lower leg: No edema.     Comments: Patient has good distal strength with no pain over the greater trochanters.  No clonus or focal weakness.  Skin:    Findings: No erythema,  lesion or rash.  Neurological:     General: No focal deficit present.     Mental Status: She is alert and oriented to person, place, and time.     Sensory: No sensory deficit.     Motor: No weakness or abnormal muscle tone.     Coordination: Coordination normal.  Psychiatric:        Mood and Affect: Mood normal.        Behavior: Behavior normal.      Imaging: No results found.

## 2020-12-03 NOTE — Patient Instructions (Signed)

## 2020-12-03 NOTE — Progress Notes (Signed)
Pt state lower back pain. Pt state walking, standing and sitting makes the pain worse. Pt state she take pain meds and excise to help ease the pain. Pt has hx of inj on 09/22/20 pt state it didn't work.  Numeric Pain Rating Scale and Functional Assessment Average Pain 5   In the last MONTH (on 0-10 scale) has pain interfered with the following?  1. General activity like being  able to carry out your everyday physical activities such as walking, climbing stairs, carrying groceries, or moving a chair?  Rating(8)   +Driver, -BT, -Dye Allergies.

## 2020-12-08 ENCOUNTER — Ambulatory Visit: Payer: Medicaid Other | Admitting: Orthopaedic Surgery

## 2020-12-17 ENCOUNTER — Other Ambulatory Visit: Payer: Self-pay

## 2020-12-17 ENCOUNTER — Encounter: Payer: Self-pay | Admitting: Orthopaedic Surgery

## 2020-12-17 ENCOUNTER — Ambulatory Visit (INDEPENDENT_AMBULATORY_CARE_PROVIDER_SITE_OTHER): Payer: Self-pay | Admitting: Orthopaedic Surgery

## 2020-12-17 VITALS — BP 134/78 | HR 93 | Ht 65.0 in | Wt 180.0 lb

## 2020-12-17 DIAGNOSIS — F17210 Nicotine dependence, cigarettes, uncomplicated: Secondary | ICD-10-CM

## 2020-12-17 DIAGNOSIS — M5442 Lumbago with sciatica, left side: Secondary | ICD-10-CM

## 2020-12-17 DIAGNOSIS — G894 Chronic pain syndrome: Secondary | ICD-10-CM

## 2020-12-17 DIAGNOSIS — G8929 Other chronic pain: Secondary | ICD-10-CM

## 2020-12-17 MED ORDER — CYCLOBENZAPRINE HCL 5 MG PO TABS: 5.0000 mg | ORAL_TABLET | Freq: Three times a day (TID) | ORAL | 2 refills | Status: DC | PRN

## 2020-12-17 NOTE — Progress Notes (Signed)
Patient Whitney Powell, female DOB:April 29, 1966, 55 y.o. JFH:[id number]  Chief Complaint  Patient presents with  . Back Pain    Saw Dr Alvester Morin 12/03/20, s/p ESI.  Still hurting a lot.  Asks for some pain meds, needs refill flexeril.  Taking ibu/aleve prn and not helping.     HPI  [name] is a 55 y.o. female who has lower back pain.  She had epidural recently by Dr. Alvester Morin. I have reviewed his notes.  She is being followed by pain management for pain medicine.  She is a little better with the back after the epidural. She is doing her exercises. I will refill her Flexeril. She has no weakness.  She has significantly cut back on her smoking and I congratulated her for this.   Body mass index is 29.95 kg/m.  ROS  Review of Systems  Constitutional: Positive for activity change.  Respiratory: Positive for shortness of breath.   Musculoskeletal: Positive for arthralgias and back pain.  Psychiatric/Behavioral: The patient is nervous/anxious.   All other systems reviewed and are negative.   All other systems reviewed and are negative.  The following is a summary of the past history medically, past history surgically, known current medicines, social history and family history.  This information is gathered electronically by the computer from prior information and documentation.  I review this each visit and have found including this information at this point in the chart is beneficial and informative.    Past Medical History:  Diagnosis Date  . Anxiety   . Bronchitis   . Depression   . GERD (gastroesophageal reflux disease)   . Neuromuscular disorder (HCC)    bilat carpal tunnel synd  . Tobacco abuse 12/12/2016    Past Surgical History:  Procedure Laterality Date  . TUBAL LIGATION  1990    Family History  Problem Relation Age of Onset  . Heart disease Mother   . Hyperlipidemia Mother   . Hypertension Mother   . Stroke Mother   . Cancer Father        pancrease  .  Diabetes Father   . Hearing loss Father   . Cancer Sister        Corky Mull  . Hepatitis C Sister   . Cancer Paternal Grandmother        pancreatic cancer    Social History Social History   Tobacco Use  . Smoking status: Current Every Day Smoker    Packs/day: 0.50    Years: 46.00    Pack years: 23.00    Types: Cigarettes    Start date: 10/31/1980  . Smokeless tobacco: Never Used  . Tobacco comment: discussed  Vaping Use  . Vaping Use: Former  Substance Use Topics  . Alcohol use: Yes    Alcohol/week: 1.0 standard drink    Types: 1 Cans of beer per week    Comment: occassional  . Drug use: No    No Known Allergies  Current Outpatient Medications  Medication Sig Dispense Refill  . citalopram (CELEXA) 20 MG tablet Take 20 mg by mouth daily.    Marland Kitchen ibuprofen (ADVIL) 200 MG tablet Take 200 mg by mouth every 6 (six) hours as needed.    . cyclobenzaprine (FLEXERIL) 5 MG tablet Take 1 tablet (5 mg total) by mouth 3 (three) times daily as needed. 45 tablet 2   No current facility-administered medications for this visit.     Physical Exam  Blood pressure 134/78, pulse 93, height 5\' 5"  (1.651 m),  weight 180 lb (81.6 kg), last menstrual period 12/12/2010.  Constitutional: overall normal hygiene, normal nutrition, well developed, normal grooming, normal body habitus. Assistive device:cane  Musculoskeletal: gait and station Limp right, muscle tone and strength are normal, no tremors or atrophy is present.  .  Neurological: coordination overall normal.  Deep tendon reflex/nerve stretch intact.  Sensation normal.  Cranial nerves II-XII intact.   Skin:   Normal overall no scars, lesions, ulcers or rashes. No psoriasis.  Psychiatric: Alert and oriented x 3.  Recent memory intact, remote memory unclear.  Normal mood and affect. Well groomed.  Good eye contact.  Cardiovascular: overall no swelling, no varicosities, no edema bilaterally, normal temperatures of the legs and arms, no  clubbing, cyanosis and good capillary refill.  Lymphatic: palpation is normal.  Spine/Pelvis examination:  Inspection:  Overall, sacoiliac joint benign and hips nontender; without crepitus or defects.   Thoracic spine inspection: Alignment normal without kyphosis present   Lumbar spine inspection:  Alignment  with normal lumbar lordosis, without scoliosis apparent.   Thoracic spine palpation:  without tenderness of spinal processes   Lumbar spine palpation: without tenderness of lumbar area; without tightness of lumbar muscles    Range of Motion:   Lumbar flexion, forward flexion is normal without pain or tenderness    Lumbar extension is full without pain or tenderness   Left lateral bend is normal without pain or tenderness   Right lateral bend is normal without pain or tenderness   Straight leg raising is normal  Strength & tone: normal   Stability overall normal stability  All other systems reviewed and are negative   The patient has been educated about the nature of the problem(s) and counseled on treatment options.  The patient appeared to understand what I have discussed and is in agreement with it.  Encounter Diagnoses  Name Primary?  . Chronic left-sided low back pain with left-sided sciatica Yes  . Nicotine dependence, cigarettes, uncomplicated   . Chronic pain syndrome     PLAN Call if any problems.  Precautions discussed.  Continue current medications.   Return to clinic 6 weeks   I refilled the Flexeril.  Electronically Signed Darreld Mclean, MD 2/17/20229:35 AM

## 2021-01-04 ENCOUNTER — Telehealth: Payer: Self-pay | Admitting: Physical Medicine and Rehabilitation

## 2021-01-04 NOTE — Telephone Encounter (Signed)
She really needs to have a consultation with Dr. Vira Browns in our office versus neurosurgeon.

## 2021-01-04 NOTE — Telephone Encounter (Signed)
Pt called stating she needs a repeat appt with Dr. Alvester Morin   [phone]

## 2021-01-04 NOTE — Telephone Encounter (Signed)
Patient had bilateral L3 TF on 12/03/20. She states that she felt some relief immediately. She reports 40% relief for 5 hours on day of injection, but pain returned. Please advise.

## 2021-01-05 NOTE — Telephone Encounter (Signed)
Left message to advise

## 2021-01-26 ENCOUNTER — Ambulatory Visit (INDEPENDENT_AMBULATORY_CARE_PROVIDER_SITE_OTHER): Payer: Self-pay | Admitting: Orthopaedic Surgery

## 2021-01-26 ENCOUNTER — Other Ambulatory Visit: Payer: Self-pay

## 2021-01-26 ENCOUNTER — Encounter: Payer: Self-pay | Admitting: Orthopaedic Surgery

## 2021-01-26 VITALS — BP 106/74 | HR 92 | Ht 65.0 in | Wt 168.0 lb

## 2021-01-26 DIAGNOSIS — G8929 Other chronic pain: Secondary | ICD-10-CM

## 2021-01-26 DIAGNOSIS — M5442 Lumbago with sciatica, left side: Secondary | ICD-10-CM

## 2021-01-26 MED ORDER — TIZANIDINE HCL 4 MG PO TABS: ORAL_TABLET | ORAL | 3 refills | Status: DC

## 2021-01-26 NOTE — Progress Notes (Signed)
Patient QI:Whitney Powell, female DOB:February 08, 1966, 55 y.o. [id number]  Chief Complaint  Patient presents with  . Back Pain    Lower back pain     HPI  Whitney Powell is a 55 y.o. female who has continued lower back pain.  She has had epidural by Dr. Alvester Morin.  Dr. Alvester Morin suggested that Dr. Otelia Sergeant see her.  I will make appointment for her to see Dr. Otelia Sergeant.  I will change her to Zanaflex, the Flexeril does not help any more.  She has no new trauma, no weakness.  She is trying to do exercises.   Body mass index is 27.96 kg/m.  ROS  Review of Systems  Constitutional: Positive for activity change.  Respiratory: Positive for shortness of breath.   Musculoskeletal: Positive for arthralgias and back pain.  Psychiatric/Behavioral: The patient is nervous/anxious.   All other systems reviewed and are negative.   All other systems reviewed and are negative.  The following is a summary of the past history medically, past history surgically, known current medicines, social history and family history.  This information is gathered electronically by the computer from prior information and documentation.  I review this each visit and have found including this information at this point in the chart is beneficial and informative.    Past Medical History:  Diagnosis Date  . Anxiety   . Bronchitis   . Depression   . GERD (gastroesophageal reflux disease)   . Neuromuscular disorder (HCC)    bilat carpal tunnel synd  . Tobacco abuse 12/12/2016    Past Surgical History:  Procedure Laterality Date  . TUBAL LIGATION  1990    Family History  Problem Relation Age of Onset  . Heart disease Mother   . Hyperlipidemia Mother   . Hypertension Mother   . Stroke Mother   . Cancer Father        pancrease  . Diabetes Father   . Hearing loss Father   . Cancer Sister        Corky Mull  . Hepatitis C Sister   . Cancer Paternal Grandmother        pancreatic cancer    Social History Social History    Tobacco Use  . Smoking status: Current Every Day Smoker    Packs/day: 0.50    Years: 46.00    Pack years: 23.00    Types: Cigarettes    Start date: 10/31/1980  . Smokeless tobacco: Never Used  . Tobacco comment: discussed  Vaping Use  . Vaping Use: Former  Substance Use Topics  . Alcohol use: Yes    Alcohol/week: 1.0 standard drink    Types: 1 Cans of beer per week    Comment: occassional  . Drug use: No    No Known Allergies  Current Outpatient Medications  Medication Sig Dispense Refill  . citalopram (CELEXA) 20 MG tablet Take 20 mg by mouth daily.    Marland Kitchen ibuprofen (ADVIL) 200 MG tablet Take 200 mg by mouth every 6 (six) hours as needed.    Marland Kitchen tiZANidine (ZANAFLEX) 4 MG tablet One by mouth every night before bed as needed for spasm 30 tablet 3   No current facility-administered medications for this visit.     Physical Exam  Blood pressure 106/74, pulse 92, height 5\' 5"  (1.651 m), weight 168 lb (76.2 kg), last menstrual period 12/12/2010.  Constitutional: overall normal hygiene, normal nutrition, well developed, normal grooming, normal body habitus. Assistive device:none  Musculoskeletal: gait and station Limp none, muscle tone  and strength are normal, no tremors or atrophy is present.  .  Neurological: coordination overall normal.  Deep tendon reflex/nerve stretch intact.  Sensation normal.  Cranial nerves II-XII intact.   Skin:   Normal overall no scars, lesions, ulcers or rashes. No psoriasis.  Psychiatric: Alert and oriented x 3.  Recent memory intact, remote memory unclear.  Normal mood and affect. Well groomed.  Good eye contact.  Cardiovascular: overall no swelling, no varicosities, no edema bilaterally, normal temperatures of the legs and arms, no clubbing, cyanosis and good capillary refill.  Lymphatic: palpation is normal.  Spine/Pelvis examination:  Inspection:  Overall, sacoiliac joint benign and hips nontender; without crepitus or defects.   Thoracic  spine inspection: Alignment normal without kyphosis present   Lumbar spine inspection:  Alignment  with normal lumbar lordosis, without scoliosis apparent.   Thoracic spine palpation:  without tenderness of spinal processes   Lumbar spine palpation: without tenderness of lumbar area; without tightness of lumbar muscles    Range of Motion:   Lumbar flexion, forward flexion is normal without pain or tenderness    Lumbar extension is full without pain or tenderness   Left lateral bend is normal without pain or tenderness   Right lateral bend is normal without pain or tenderness   Straight leg raising is normal  Strength & tone: normal   Stability overall normal stability  All other systems reviewed and are negative   The patient has been educated about the nature of the problem(s) and counseled on treatment options.  The patient appeared to understand what I have discussed and is in agreement with it.  Encounter Diagnosis  Name Primary?  . Chronic left-sided low back pain with left-sided sciatica Yes    PLAN Call if any problems.  Precautions discussed.  Continue current medications.   Return to clinic to see Dr. Otelia Sergeant   Electronically Signed Darreld Mclean, MD 3/29/20221:49 PM

## 2021-01-28 ENCOUNTER — Ambulatory Visit: Payer: Medicaid Other | Admitting: Orthopaedic Surgery

## 2021-02-08 ENCOUNTER — Encounter: Payer: Self-pay | Admitting: Physician Assistant

## 2021-02-08 ENCOUNTER — Ambulatory Visit: Payer: Medicaid Other | Admitting: Physician Assistant

## 2021-02-08 VITALS — BP 98/68 | HR 104 | Temp 97.3°F | Wt 172.0 lb

## 2021-02-08 DIAGNOSIS — F17200 Nicotine dependence, unspecified, uncomplicated: Secondary | ICD-10-CM

## 2021-02-08 DIAGNOSIS — Z0000 Encounter for general adult medical examination without abnormal findings: Secondary | ICD-10-CM

## 2021-02-08 DIAGNOSIS — R000 Tachycardia, unspecified: Secondary | ICD-10-CM

## 2021-02-08 DIAGNOSIS — Z1211 Encounter for screening for malignant neoplasm of colon: Secondary | ICD-10-CM

## 2021-02-08 DIAGNOSIS — Z1239 Encounter for other screening for malignant neoplasm of breast: Secondary | ICD-10-CM

## 2021-02-08 NOTE — Progress Notes (Signed)
BP 98/68   Pulse (!) 104   Temp (!) 97.3 F (36.3 C)   Wt 172 lb (78 kg)   LMP 12/12/2010 (Approximate)   SpO2 99%   BMI 28.62 kg/m    Subjective:    Patient ID: Whitney Powell, female    DOB: 1966-10-28, 55 y.o.   MRN: [id number]  HPI: Whitney Powell is a 55 y.o. female presenting on 02/08/2021 for Annual Exam   HPI   Pt had a negative covid 19 screening questionnaire.   Chief Complaint  Patient presents with  . Annual Exam     Pt Goes to [facility] for [facility] care.  She sees dr Hilda Lias for LBP.  She Went to the dentist.  She needs tooth extraction.  It's fine now.    But still needs to get pulled.  Pt has not gotten covid vaccination.  She says she has no other issues today.       Relevant past medical, surgical, family and social history reviewed and updated as indicated. Interim medical history since our last visit reviewed. Allergies and medications reviewed and updated.   Current Outpatient Medications:  .  citalopram (CELEXA) 20 MG tablet, Take 20 mg by mouth daily., Disp: , Rfl:  .  cyclobenzaprine (FLEXERIL) 5 MG tablet, Take 5 mg by mouth 3 (three) times daily as needed for muscle spasms., Disp: , Rfl:  .  ibuprofen (ADVIL) 200 MG tablet, Take 200 mg by mouth every 6 (six) hours as needed., Disp: , Rfl:  .  tiZANidine (ZANAFLEX) 4 MG tablet, One by mouth every night before bed as needed for spasm, Disp: 30 tablet, Rfl: 3    Review of Systems  Per HPI unless specifically indicated above     Objective:    BP 98/68   Pulse (!) 104   Temp (!) 97.3 F (36.3 C)   Wt 172 lb (78 kg)   LMP 12/12/2010 (Approximate)   SpO2 99%   BMI 28.62 kg/m   Wt Readings from Last 3 Encounters:  02/08/21 172 lb (78 kg)  01/26/21 168 lb (76.2 kg)  12/17/20 180 lb (81.6 kg)     Pulse 104    Physical Exam Vitals reviewed.  Constitutional:      General: She is not in acute distress.    Appearance: She is well-developed. She is not toxic-appearing.  HENT:      Head: Normocephalic and atraumatic.  Cardiovascular:     Rate and Rhythm: Regular rhythm. Tachycardia present.     Heart sounds: No murmur heard.   Pulmonary:     Effort: Pulmonary effort is normal.     Breath sounds: Normal breath sounds.  Abdominal:     General: Bowel sounds are normal.     Palpations: Abdomen is soft. There is no mass.     Tenderness: There is no abdominal tenderness.  Musculoskeletal:     Cervical back: Neck supple.     Right lower leg: No edema.     Left lower leg: No edema.  Lymphadenopathy:     Cervical: No cervical adenopathy.  Skin:    General: Skin is warm and dry.  Neurological:     Mental Status: She is alert and oriented to person, place, and time.     Gait: Gait is intact.  Psychiatric:        Attention and Perception: Attention normal.        Speech: Speech normal.        Behavior: Behavior  normal. Behavior is cooperative.      EKG- poor quality - sinus rhythm at 84 bpm  With no st-t changes.  No previous or comparison    Assessment & Plan:   Encounter Diagnoses  Name Primary?  . Visit for annual health examination Yes  . Tachycardia   . Encounter for screening for malignant neoplasm of breast, unspecified screening modality   . Screening for colon cancer   . Tobacco use disorder       -pt is referred for screening Mammogram -pt to return to office 1 month to update PAP -No blood labs needed at this time -pt was given ifobt for colon cancer screening  -pt is on Dental list -pt to continue with [facility] for MH issues -encouraged smoking cessation -encouraged pt to get covid vaccination

## 2021-02-23 ENCOUNTER — Other Ambulatory Visit: Payer: Self-pay | Admitting: Physician Assistant

## 2021-02-23 DIAGNOSIS — Z1211 Encounter for screening for malignant neoplasm of colon: Secondary | ICD-10-CM

## 2021-02-23 LAB — IFOBT (OCCULT BLOOD): IFOBT: NEGATIVE

## 2021-02-23 NOTE — Progress Notes (Unsigned)
ifo 

## 2021-03-01 ENCOUNTER — Other Ambulatory Visit: Payer: Self-pay

## 2021-03-01 ENCOUNTER — Ambulatory Visit (HOSPITAL_COMMUNITY)
Admission: RE | Admit: 2021-03-01 | Discharge: 2021-03-01 | Disposition: A | Discharge status: Home / Self Care | Payer: Self-pay | Source: Ambulatory Visit | Attending: Physician Assistant | Admitting: Physician Assistant

## 2021-03-01 DIAGNOSIS — Z1239 Encounter for other screening for malignant neoplasm of breast: Secondary | ICD-10-CM | POA: Insufficient documentation

## 2021-03-08 ENCOUNTER — Ambulatory Visit: Payer: Medicaid Other | Admitting: Physician Assistant

## 2021-03-25 ENCOUNTER — Other Ambulatory Visit: Payer: Self-pay

## 2021-03-25 ENCOUNTER — Encounter: Payer: Self-pay | Admitting: Specialist

## 2021-03-25 ENCOUNTER — Ambulatory Visit: Payer: Self-pay

## 2021-03-25 ENCOUNTER — Ambulatory Visit (INDEPENDENT_AMBULATORY_CARE_PROVIDER_SITE_OTHER): Payer: Medicare Other | Admitting: Specialist

## 2021-03-25 VITALS — BP 101/69 | HR 73 | Ht 65.0 in | Wt 172.0 lb

## 2021-03-25 DIAGNOSIS — M545 Low back pain, unspecified: Secondary | ICD-10-CM

## 2021-03-25 DIAGNOSIS — M48062 Spinal stenosis, lumbar region with neurogenic claudication: Secondary | ICD-10-CM | POA: Diagnosis not present

## 2021-03-25 DIAGNOSIS — M5136 Other intervertebral disc degeneration, lumbar region: Secondary | ICD-10-CM

## 2021-03-25 NOTE — Patient Instructions (Signed)
Avoid bending, stooping and avoid lifting weights greater than 10 lbs. Avoid prolong standing and walking. Order for a new walker with wheels. Surgery scheduling secretary Tivis Ringer, will call you in the next week to schedule for surgery.  Surgery recommended is a three level lumbar decompressive laminectomy L3-4, L4-5 and L5-S1 this would be done with the microscope.  Take hydrocodone for for pain. Risk of surgery includes risk of infection 1 in 200 patients, bleeding 1/2% chance you would need a transfusion.   Risk to the nerves is one in 10,000.  Expect improved walking and standing tolerance. Expect relief of leg pain but numbness may persist depending on the length and degree of pressure that has been present.

## 2021-03-25 NOTE — Progress Notes (Signed)
Office Visit Note   Patient: Whitney Powell           Date of Birth: May 20, 1966           MRN: [id number] Visit Date: 03/25/2021              Requested by: Darreld Mclean, MD [id number] [place],  [place] [address] PCP: Jacquelin Hawking, PA-C   Assessment & Plan: Visit Diagnoses:  1. Low back pain, unspecified back pain laterality, unspecified chronicity, unspecified whether sciatica present     Plan: Avoid bending, stooping and avoid lifting weights greater than 10 lbs. Avoid prolong standing and walking. Order for a new walker with wheels. Surgery scheduling secretary Tivis Ringer, will call you in the next week to schedule for surgery.  Surgery recommended is a three level lumbar decompressive laminectomy L3-4, L4-5 and L5-S1 this would be done with the microscope.  Take hydrocodone for for pain. Risk of surgery includes risk of infection 1 in 200 patients, bleeding 1/2% chance you would need a transfusion.   Risk to the nerves is one in 10,000.  Expect improved walking and standing tolerance. Expect relief of leg pain but numbness may persist depending on the length and degree of pressure that has been present.  Follow-Up Instructions: Return in about 4 weeks (around 04/22/2021).   Orders:  Orders Placed This Encounter  Procedures  . XR Lumbar Spine 2-3 Views   No orders of the defined types were placed in this encounter.     Procedures: No procedures performed   Clinical Data: No additional findings.   Subjective: Chief Complaint  Patient presents with  . Lower Back - Pain    HPI  Review of Systems  Constitutional: Negative.   HENT: Negative.   Eyes: Negative.   Respiratory: Negative.   Cardiovascular: Negative.   Gastrointestinal: Negative.   Endocrine: Negative.   Genitourinary: Negative.   Musculoskeletal: Negative.   Skin: Negative.   Allergic/Immunologic: Negative.   Neurological: Negative.   Hematological: Negative.    Psychiatric/Behavioral: Negative.      Objective: Vital Signs: BP 101/69 (BP Location: Left Arm, Patient Position: Sitting)   Pulse 73   Ht 5\' 5"  (1.651 m)   Wt 172 lb (78 kg)   LMP 12/12/2010 (Approximate)   BMI 28.62 kg/m   Physical Exam Constitutional:      Appearance: She is well-developed.  HENT:     Head: Normocephalic and atraumatic.  Eyes:     Pupils: Pupils are equal, round, and reactive to light.  Pulmonary:     Effort: Pulmonary effort is normal.     Breath sounds: Normal breath sounds.  Abdominal:     General: Bowel sounds are normal.     Palpations: Abdomen is soft.  Musculoskeletal:     Cervical back: Normal range of motion and neck supple.     Lumbar back: Negative right straight leg raise test and negative left straight leg raise test.  Skin:    General: Skin is warm and dry.  Neurological:     Mental Status: She is alert and oriented to person, place, and time.  Psychiatric:        Behavior: Behavior normal.        Thought Content: Thought content normal.        Judgment: Judgment normal.     Back Exam   Tenderness  The patient is experiencing tenderness in the lumbar.  Range of Motion  Extension: abnormal  Flexion: normal  Lateral bend right: normal  Lateral bend left: normal  Rotation right: normal  Rotation left: normal   Muscle Strength  Right Quadriceps:  5/5  Left Quadriceps:  5/5  Right Hamstrings:  5/5  Left Hamstrings:  5/5   Tests  Straight leg raise right: negative Straight leg raise left: negative  Reflexes  Patellar: 0/4 Achilles: 0/4 Biceps: 0/4  Other  Toe walk: abnormal Heel walk: abnormal Sensation: decreased Gait: abnormal   Comments:  Using a cane for ambulation.      Specialty Comments:  No specialty comments available.  Imaging: No results found.   PMFS History: Patient Active Problem List   Diagnosis Date Noted  . GERD (gastroesophageal reflux disease)   . Auditory hallucination  11/30/2017  . Anxiety and depression 01/02/2017  . Alcohol abuse 01/02/2017  . Attention deficit disorder (ADD) 01/02/2017  . CTS (carpal tunnel syndrome) 12/12/2016  . Tobacco abuse 12/12/2016  . Chronic hepatitis C without hepatic coma (HCC) 12/12/2016   Past Medical History:  Diagnosis Date  . Anxiety   . Bronchitis   . Depression   . GERD (gastroesophageal reflux disease)   . Neuromuscular disorder (HCC)    bilat carpal tunnel synd  . Tobacco abuse 12/12/2016    Family History  Problem Relation Age of Onset  . Heart disease Mother   . Hyperlipidemia Mother   . Hypertension Mother   . Stroke Mother   . Cancer Father        pancrease  . Diabetes Father   . Hearing loss Father   . Cancer Sister        Corky Mull  . Hepatitis C Sister   . Cancer Paternal Grandmother        pancreatic cancer    Past Surgical History:  Procedure Laterality Date  . TUBAL LIGATION  1990   Social History   Occupational History  . Not on file  Tobacco Use  . Smoking status: Current Every Day Smoker    Packs/day: 0.50    Years: 46.00    Pack years: 23.00    Types: Cigarettes    Start date: 10/31/1980  . Smokeless tobacco: Never Used  . Tobacco comment: discussed  Vaping Use  . Vaping Use: Former  Substance and Sexual Activity  . Alcohol use: Yes    Alcohol/week: 1.0 standard drink    Types: 1 Cans of beer per week    Comment: occassional  . Drug use: No  . Sexual activity: Not Currently

## 2021-04-08 ENCOUNTER — Telehealth: Payer: Self-pay | Admitting: Orthopaedic Surgery

## 2021-04-29 ENCOUNTER — Ambulatory Visit: Payer: Medicare Other | Admitting: Specialist

## 2021-05-05 ENCOUNTER — Encounter: Payer: Self-pay | Admitting: Specialist

## 2021-05-05 ENCOUNTER — Ambulatory Visit (INDEPENDENT_AMBULATORY_CARE_PROVIDER_SITE_OTHER): Payer: Medicare Other | Admitting: Specialist

## 2021-05-05 ENCOUNTER — Other Ambulatory Visit: Payer: Self-pay

## 2021-05-05 VITALS — BP 103/55 | HR 78 | Ht 65.0 in | Wt 172.0 lb

## 2021-05-05 DIAGNOSIS — M545 Low back pain, unspecified: Secondary | ICD-10-CM

## 2021-05-24 ENCOUNTER — Other Ambulatory Visit: Payer: Self-pay | Admitting: Orthopaedic Surgery

## 2021-05-24 NOTE — Telephone Encounter (Signed)
She requested change to Zanaflex in March saying that Flexeril did not help. I have not seen since March

## 2021-05-26 ENCOUNTER — Other Ambulatory Visit: Payer: Self-pay | Admitting: Specialist

## 2021-05-26 MED ORDER — CYCLOBENZAPRINE HCL 5 MG PO TABS: 5.0000 mg | ORAL_TABLET | Freq: Three times a day (TID) | ORAL | 0 refills | Status: DC | PRN

## 2021-05-26 NOTE — Addendum Note (Signed)
Addended by: Penne Lash, Otis Dials on: 05/26/2021 04:27 PM   Modules accepted: Orders

## 2021-05-26 NOTE — Telephone Encounter (Signed)
Pt called and was wondering if you can refill her flexeril. The doctors office keeling told her she had to ask nitka.

## 2021-06-19 ENCOUNTER — Other Ambulatory Visit: Payer: Self-pay | Admitting: Specialist

## 2021-06-21 ENCOUNTER — Other Ambulatory Visit: Payer: Self-pay | Admitting: Orthopaedic Surgery

## 2021-06-21 NOTE — Telephone Encounter (Signed)
Patient of Dr Sanjuan Dame requests prescription for Flexeril 5 mgs.  Qty 45  Sig: Take 1 tablet (5 mg total) by mouth 3 (three) times daily as needed.  Patient uses Psychologist, forensic

## 2021-06-22 MED ORDER — CYCLOBENZAPRINE HCL 5 MG PO TABS: 5.0000 mg | ORAL_TABLET | Freq: Three times a day (TID) | ORAL | 0 refills | Status: DC | PRN

## 2021-06-22 NOTE — Telephone Encounter (Signed)
Pt needs an appt

## 2021-06-29 ENCOUNTER — Encounter: Payer: Self-pay | Admitting: Nurse Practitioner

## 2021-06-29 ENCOUNTER — Ambulatory Visit (INDEPENDENT_AMBULATORY_CARE_PROVIDER_SITE_OTHER): Payer: Medicare Other | Admitting: Nurse Practitioner

## 2021-06-29 ENCOUNTER — Other Ambulatory Visit: Payer: Self-pay

## 2021-06-29 VITALS — BP 91/49 | HR 69 | Temp 98.1°F | Ht 65.0 in | Wt 169.0 lb

## 2021-06-29 DIAGNOSIS — F32A Depression, unspecified: Secondary | ICD-10-CM

## 2021-06-29 DIAGNOSIS — Z7689 Persons encountering health services in other specified circumstances: Secondary | ICD-10-CM

## 2021-06-29 DIAGNOSIS — H6503 Acute serous otitis media, bilateral: Secondary | ICD-10-CM | POA: Insufficient documentation

## 2021-06-29 DIAGNOSIS — F419 Anxiety disorder, unspecified: Secondary | ICD-10-CM | POA: Diagnosis not present

## 2021-06-29 DIAGNOSIS — Z748 Other problems related to care provider dependency: Secondary | ICD-10-CM

## 2021-06-29 DIAGNOSIS — M5430 Sciatica, unspecified side: Secondary | ICD-10-CM | POA: Insufficient documentation

## 2021-06-29 DIAGNOSIS — G4700 Insomnia, unspecified: Secondary | ICD-10-CM | POA: Insufficient documentation

## 2021-06-29 MED ORDER — FLUTICASONE PROPIONATE 50 MCG/ACT NA SUSP: 2.0000 | Freq: Every day | NASAL | 6 refills | Status: DC

## 2021-06-29 NOTE — Assessment & Plan Note (Addendum)
-  followed by Dr. Otelia Sergeant with [facility] -has upcoming appointment -uses flexeril during the day and tizanidine at bedtime

## 2021-06-29 NOTE — Progress Notes (Signed)
New Patient Office Visit  Subjective:  Patient ID: Whitney Powell, female    DOB: 1966/06/12  Age: 55 y.o. MRN: [id number]  CC:  Chief Complaint  Patient presents with   New Patient (Initial Visit)    Here to establish care. Complains of bilateral ear pain, ongoing x4 days. Also complains of sciatica intermittently, has a flare up now.     HPI Whitney Powell presents for new patient visit. Transferring care from Whitney Powell. Last physical was over a year ago. Last labs were drawn over a year ago.  She was seeing Whitney Powell for back pain. Last visit, she left without being seen.  She has pain in both ears. She has sharp, stabbing pain intermittently as well as constant aching. She states this started since she moved here 5-6 years ago. She has tried nasal spray and medicated ear drops, and she found a OTC ear drop that worked. She states it numbed her ears, but she can't remember what it is called. She hasn't been able to find it at the pharmacy again. She states she had tooth pain and was supposed to go to dentist, but she hasn't done that yet. Dental pain has bene ongoing for a year.  Past Medical History:  Diagnosis Date   Anxiety    Back pain    Bronchitis    Depression    GERD (gastroesophageal reflux disease)    Neuromuscular disorder (HCC)    bilat carpal tunnel synd   Tobacco abuse 12/12/2016    Past Surgical History:  Procedure Laterality Date   TUBAL LIGATION  1990    Family History  Problem Relation Age of Onset   Heart disease Mother    Hyperlipidemia Mother    Hypertension Mother    Stroke Mother    Cancer Father        pancrease   Diabetes Father    Hearing loss Father    Cancer Sister        ovairan   Hepatitis C Sister    Cancer Paternal Grandmother        pancreatic cancer    Social History   Socioeconomic History   Marital status: Single    Spouse name: Not on file   Number of children: 1   Years of education: 12   Highest education level:  Not on file  Occupational History   Occupation: Disabled- sciatica/back pain; approved 6-8 months ago (early 2022)  Tobacco Use   Smoking status: Every Day    Packs/day: 0.50    Years: 42.00    Pack years: 21.00    Types: Cigarettes    Start date: 10/31/1980   Smokeless tobacco: Never   Tobacco comments:    discussed  Vaping Use   Vaping Use: Former  Substance and Sexual Activity   Alcohol use: Not Currently    Alcohol/week: 1.0 standard drink    Types: 1 Cans of beer per week    Comment: occassional   Drug use: No   Sexual activity: Not Currently  Other Topics Concern   Not on file  Social History Narrative   Lives alone; has son      Social Determinants of Health   Financial Resource Strain: Not on file  Food Insecurity: Not on file  Transportation Needs: Not on file  Physical Activity: Not on file  Stress: Not on file  Social Connections: Not on file  Intimate Partner Violence: Not on file    ROS Review of Systems  Constitutional: Negative.   Respiratory: Negative.    Cardiovascular: Negative.   Musculoskeletal:  Positive for back pain.  Psychiatric/Behavioral:  Positive for dysphoric mood and sleep disturbance. Negative for self-injury and suicidal ideas. The patient is nervous/anxious.        Anx/depression has been well-controlled   Objective:   Today's Vitals: BP (!) 91/49 (BP Location: Left Arm, Patient Position: Sitting, Cuff Size: Normal)   Pulse 69   Temp 98.1 F (36.7 C) (Oral)   Ht $R'5\' 5"'Ff$  (1.651 m)   Wt 169 lb (76.7 kg)   LMP 12/12/2010 (Approximate)   SpO2 94%   BMI 28.12 kg/m   Physical Exam Constitutional:      Appearance: Normal appearance.  Cardiovascular:     Rate and Rhythm: Normal rate and regular rhythm.     Pulses: Normal pulses.     Heart sounds: Normal heart sounds.  Pulmonary:     Effort: Pulmonary effort is normal.     Breath sounds: Normal breath sounds.  Neurological:     Mental Status: She is alert.  Psychiatric:         Mood and Affect: Mood normal.        Behavior: Behavior normal.        Thought Content: Thought content normal.        Judgment: Judgment normal.    Assessment & Plan:   Problem List Items Addressed This Visit       Nervous and Auditory   Bilateral acute serous otitis media    -Rx. flonase -no sign of infection on exam today, but she did have some serous fluid behind her TMs -if no improvement with flonase, would consider ENT      Relevant Medications   fluticasone (FLONASE) 50 MCG/ACT nasal spray   Sciatica    -followed by Whitney Powell with [facility] -has upcoming appointment -uses flexeril during the day and tizanidine at bedtime      Relevant Medications   traZODone (DESYREL) 100 MG tablet     Other   Anxiety and depression    -doing well with celexa      Relevant Medications   traZODone (DESYREL) 100 MG tablet   Other Relevant Orders   CBC with Differential/Platelet   CMP14+EGFR   Lipid Panel With LDL/HDL Ratio   TSH   Encounter to establish care - Primary    -was going to free clinic previously, but got approved for disability, so she has medicare now -obtain records      Relevant Orders   CBC with Differential/Platelet   CMP14+EGFR   Lipid Panel With LDL/HDL Ratio   Assistance needed with transportation    -states she had to leave Whitney Powell without being seen d/t transportation issues -referral to community care coordinator; will try to get her set up with [facility]      Relevant Orders   AMB Referral to [facility]   Insomnia    -doing well with trazodone       Outpatient Encounter Medications as of 06/29/2021  Medication Sig   citalopram (CELEXA) 20 MG tablet Take 20 mg by mouth daily.   cyclobenzaprine (FLEXERIL) 5 MG tablet Take 1 tablet (5 mg total) by mouth 3 (three) times daily as needed.   fluticasone (FLONASE) 50 MCG/ACT nasal spray Place 2 sprays into both nostrils daily.   ibuprofen (ADVIL) 200 MG tablet Take 200 mg by  mouth every 6 (six) hours as needed.   tiZANidine (ZANAFLEX) 4 MG tablet  One by mouth every night before bed as needed for spasm   traZODone (DESYREL) 100 MG tablet Take 100 mg by mouth at bedtime.   No facility-administered encounter medications on file as of 06/29/2021.    Follow-up: Return in about 6 weeks (around 08/10/2021) for Physical Exam.   Noreene Larsson, NP

## 2021-06-29 NOTE — Assessment & Plan Note (Signed)
-  doing well with celexa

## 2021-06-29 NOTE — Assessment & Plan Note (Signed)
-  was going to free clinic previously, but got approved for disability, so she has medicare now -obtain records

## 2021-06-29 NOTE — Patient Instructions (Signed)
Please have fasting labs drawn 2-3 days prior to your appointment so we can discuss the results during your office visit.  

## 2021-06-29 NOTE — Assessment & Plan Note (Signed)
-  doing well with trazodone

## 2021-06-29 NOTE — Assessment & Plan Note (Signed)
-  states she had to leave Dr. Barbaraann Faster without being seen d/t transportation issues -referral to community care coordinator; will try to get her set up with [facility]

## 2021-06-29 NOTE — Assessment & Plan Note (Signed)
-  Rx. flonase -no sign of infection on exam today, but she did have some serous fluid behind her TMs -if no improvement with flonase, would consider ENT

## 2021-07-01 ENCOUNTER — Encounter: Payer: Self-pay | Admitting: Specialist

## 2021-07-01 ENCOUNTER — Other Ambulatory Visit: Payer: Self-pay

## 2021-07-01 ENCOUNTER — Ambulatory Visit (INDEPENDENT_AMBULATORY_CARE_PROVIDER_SITE_OTHER): Payer: Medicare Other | Admitting: Specialist

## 2021-07-01 VITALS — BP 104/67 | HR 59 | Ht 65.0 in | Wt 172.0 lb

## 2021-07-01 DIAGNOSIS — M5136 Other intervertebral disc degeneration, lumbar region: Secondary | ICD-10-CM

## 2021-07-01 DIAGNOSIS — M48062 Spinal stenosis, lumbar region with neurogenic claudication: Secondary | ICD-10-CM

## 2021-07-01 DIAGNOSIS — M545 Low back pain, unspecified: Secondary | ICD-10-CM | POA: Diagnosis not present

## 2021-07-01 DIAGNOSIS — G894 Chronic pain syndrome: Secondary | ICD-10-CM

## 2021-07-01 MED ORDER — TRAMADOL HCL 50 MG PO TABS: 50.0000 mg | ORAL_TABLET | Freq: Four times a day (QID) | ORAL | 0 refills | Status: DC | PRN

## 2021-07-01 NOTE — Progress Notes (Signed)
Office Visit Note   Patient: Whitney Powell           Date of Birth: [birth year]           MRN: [id number] Visit Date: 07/01/2021              Requested by: Jacquelin Hawking, PA-C [address] [place],  [place] [address] PCP: Heather Roberts, NP   Assessment & Plan: Visit Diagnoses:  1. Spinal stenosis of lumbar region with neurogenic claudication   2. Degenerative disc disease, lumbar   3. Chronic pain syndrome     Plan: Avoid bending, stooping and avoid lifting weights greater than 10 lbs. Avoid prolong standing and walking. Order for a new walker with wheels. Surgery scheduling secretary Tivis Ringer, will call you in the next week to schedule for surgery.  Surgery recommended is a three level lumbar decompressive laminectomy L3-4, L4-5 and L5-S1 this would be done with the microscope.  Take hydrocodone for for pain. Risk of surgery includes risk of infection 1 in 200 patients, bleeding 1/2% chance you would need a transfusion.   Risk to the nerves is one in 10,000.   Expect improved walking and standing tolerance. Expect relief of leg pain but numbness may persist depending on the length and degree of pressure that has been present. Tramadol for pain and flexeril.    Follow-Up Instructions: Return in about 4 weeks (around 04/22/2021).     Follow-Up Instructions: No follow-ups on file.   Orders:  No orders of the defined types were placed in this encounter.  No orders of the defined types were placed in this encounter.     Procedures: No procedures performed   Clinical Data: No additional findings.   Subjective: Chief Complaint  Patient presents with   Lower Back - Follow-up    55 year old female with history of back pain and leg numbness both legs posteriorly more than anterior with extension into the feet. 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 She has been seen by Dr. Hilda Lias  and assessed and at her last visit studies suggested lumbar spinal stenosis and I recommended considering a lumbar decompressive laminectomy.   Review of Systems  Constitutional:  Positive for unexpected weight change. Negative for activity change, appetite change, chills, diaphoresis, fatigue and fever.  HENT:  Positive for congestion, dental problem (has a tooth that needs treatment.) and rhinorrhea. Negative for ear discharge, ear pain, facial swelling, hearing loss, mouth sores, nosebleeds, postnasal drip, sinus pressure, sinus pain, sneezing, sore throat, tinnitus, trouble swallowing and voice change.   Eyes: Negative.  Negative for photophobia, pain, discharge, redness, itching and visual disturbance.  Respiratory: Negative.  Negative for apnea, cough, choking, chest tightness, shortness of breath, wheezing and stridor.   Cardiovascular: Negative.  Negative for chest pain, palpitations and leg swelling.  Gastrointestinal: Negative.  Negative for abdominal distention, abdominal pain, anal bleeding, blood in stool, constipation, diarrhea, nausea, rectal pain and vomiting.  Endocrine: Negative.   Genitourinary: Negative.   Musculoskeletal:  Positive for back pain and gait problem. Negative for arthralgias, joint swelling, myalgias, neck pain and neck stiffness.  Skin: Negative.  Negative for color change, pallor, rash and wound.  Allergic/Immunologic: Negative.  Negative for environmental allergies, food allergies and immunocompromised state.  Neurological:  Positive for weakness and numbness. Negative for dizziness, tremors, seizures, syncope,  facial asymmetry, speech difficulty, light-headedness and headaches.  Hematological: Negative.  Negative for adenopathy. Does not bruise/bleed easily.  Psychiatric/Behavioral: Negative.  Negative for agitation, behavioral problems, confusion, decreased concentration, dysphoric mood, hallucinations,  self-injury, sleep disturbance and suicidal ideas. The patient is not nervous/anxious and is not hyperactive.     Objective: Vital Signs: BP 104/67 (BP Location: Left Arm, Patient Position: Sitting)   Pulse (!) 59   Ht 5\' 5"  (1.651 m)   Wt 172 lb (78 kg)   LMP 12/12/2010 (Approximate)   BMI 28.62 kg/m   Physical Exam Constitutional:      Appearance: She is well-developed.  HENT:     Head: Normocephalic and atraumatic.  Eyes:     Pupils: Pupils are equal, round, and reactive to light.  Pulmonary:     Effort: Pulmonary effort is normal.     Breath sounds: Normal breath sounds.  Abdominal:     General: Bowel sounds are normal.     Palpations: Abdomen is soft.  Musculoskeletal:     Cervical back: Normal range of motion and neck supple.     Lumbar back: Negative right straight leg raise test and negative left straight leg raise test.  Skin:    General: Skin is warm and dry.  Neurological:     Mental Status: She is alert and oriented to person, place, and time.  Psychiatric:        Behavior: Behavior normal.        Thought Content: Thought content normal.        Judgment: Judgment normal.    Back Exam   Tenderness  The patient is experiencing tenderness in the lumbar.  Range of Motion  Extension:  abnormal  Flexion:  abnormal  Lateral bend right:  abnormal  Lateral bend left:  abnormal  Rotation right:  abnormal   Muscle Strength  Right Quadriceps:  5/5  Left Quadriceps:  4/5  Right Hamstrings:  5/5  Left Hamstrings:  5/5   Tests  Straight leg raise right: negative Straight leg raise left: negative  Reflexes  Patellar:  2/4 Achilles:  0/4  Other  Toe walk: abnormal Heel walk: abnormal  Comments:  Left greater than right side buttock pain and left EHL is 4/5 bilateral foot DF 4/5 right EHL 4/5. SLR negative.      Specialty Comments:  No specialty comments available.  Imaging: No results found.   PMFS History: Patient Active Problem List    Diagnosis Date Noted   Bilateral acute serous otitis media 06/29/2021   Encounter to establish care 06/29/2021   Assistance needed with transportation 06/29/2021   Sciatica 06/29/2021   Insomnia 06/29/2021   GERD (gastroesophageal reflux disease)    Auditory hallucination 11/30/2017   Anxiety and depression 01/02/2017   Alcohol abuse 01/02/2017   Attention deficit disorder (ADD) 01/02/2017   CTS (carpal tunnel syndrome) 12/12/2016   Tobacco abuse 12/12/2016   Chronic hepatitis C without hepatic coma (HCC) 12/12/2016   Past Medical History:  Diagnosis Date   Anxiety    Back pain    Bronchitis    Depression    GERD (gastroesophageal reflux disease)    Neuromuscular disorder (HCC)    bilat carpal tunnel synd   Tobacco abuse 12/12/2016    Family History  Problem Relation Age of Onset   Heart disease Mother    Hyperlipidemia Mother    Hypertension Mother    Stroke Mother    Cancer Father  pancrease   Diabetes Father    Hearing loss Father    Cancer Sister        ovairan   Hepatitis C Sister    Cancer Paternal Grandmother        pancreatic cancer    Past Surgical History:  Procedure Laterality Date   TUBAL LIGATION  1990   Social History   Occupational History   Occupation: Disabled- sciatica/back pain; approved 6-8 months ago (early 2022)  Tobacco Use   Smoking status: Every Day    Packs/day: 0.50    Years: 42.00    Pack years: 21.00    Types: Cigarettes    Start date: 10/31/1980   Smokeless tobacco: Never   Tobacco comments:    discussed  Vaping Use   Vaping Use: Former  Substance and Sexual Activity   Alcohol use: Not Currently    Alcohol/week: 1.0 standard drink    Types: 1 Cans of beer per week    Comment: occassional   Drug use: No   Sexual activity: Not Currently

## 2021-07-01 NOTE — Patient Instructions (Signed)
Plan: Avoid bending, stooping and avoid lifting weights greater than 10 lbs. Avoid prolong standing and walking. Order for a new walker with wheels. Surgery scheduling secretary Tivis Ringer, will call you in the next week to schedule for surgery.  Surgery recommended is a three level lumbar decompressive laminectomy L3-4, L4-5 and L5-S1 this would be done with the microscope.  Take hydrocodone for for pain. Risk of surgery includes risk of infection 1 in 200 patients, bleeding 1/2% chance you would need a transfusion.   Risk to the nerves is one in 10,000.   Expect improved walking and standing tolerance. Expect relief of leg pain but numbness may persist depending on the length and degree of pressure that has been present. Tramadol for pain and flexeril.    Follow-Up Instructions: Return in about 4 weeks (around 04/22/2021).

## 2021-07-09 ENCOUNTER — Telehealth: Payer: Self-pay | Admitting: Nurse Practitioner

## 2021-07-09 NOTE — Telephone Encounter (Signed)
   Telephone encounter was:  Successful.  07/09/2021 Name: Whitney Powell MRN: [id number] DOB: 09/17/1966  Whitney Powell is a 55 y.o. year old female who is a primary care patient of Heather Roberts, NP . The community resource team was consulted for assistance with Transportation Needs  and Dental care.  Care guide performed the following interventions: Patient provided with information about care guide support team and interviewed to confirm resource needs Will call back on Monday with Medicaid Transportation for [facility] and advised to call the number on her Medicaid card for a list of Dentists .  Follow Up Plan:  Care guide will follow up with patient by phone over the next week.  April Green Care Guide, Embedded Care Coordination [facility], Care Management Phone: [phone] Email: [email or web address]

## 2021-07-12 ENCOUNTER — Other Ambulatory Visit: Payer: Self-pay | Admitting: Specialist

## 2021-07-12 NOTE — Telephone Encounter (Signed)
Pt would like to know if she can get a refill on tramadol.   CB [phone]

## 2021-07-13 MED ORDER — TRAMADOL HCL 50 MG PO TABS: 50.0000 mg | ORAL_TABLET | Freq: Four times a day (QID) | ORAL | 0 refills | Status: DC | PRN

## 2021-07-23 ENCOUNTER — Other Ambulatory Visit: Payer: Self-pay

## 2021-07-23 ENCOUNTER — Ambulatory Visit (INDEPENDENT_AMBULATORY_CARE_PROVIDER_SITE_OTHER): Payer: Medicare Other | Admitting: *Deleted

## 2021-07-23 DIAGNOSIS — Z0000 Encounter for general adult medical examination without abnormal findings: Secondary | ICD-10-CM

## 2021-07-23 NOTE — Progress Notes (Signed)
Subjective:   Whitney Powell is a 55 y.o. female who presents for Medicare Annual (Subsequent) preventive examination. I connected with  Amaris Pietila on 07/23/21 by an audio enabled telemedicine application and verified that I am speaking with the correct person using two identifiers.   I discussed the limitations, risks, security and privacy concerns of performing an evaluation and management service by telephone and the availability of in person appointments. I also discussed with the patient that there may be a patient responsible charge related to this service. The patient expressed understanding and verbally consented to this telephonic visit.  Review of Systems           Objective:    There were no vitals filed for this visit. There is no height or weight on file to calculate BMI.  Advanced Directives 12/17/2018 11/13/2018 10/02/2018  Does Patient Have a Medical Advance Directive? No No No  Would patient like information on creating a medical advance directive? - No - Patient declined No - Patient declined    Current Medications (verified) Outpatient Encounter Medications as of 07/23/2021  Medication Sig   citalopram (CELEXA) 20 MG tablet Take 20 mg by mouth daily.   cyclobenzaprine (FLEXERIL) 5 MG tablet Take 1 tablet (5 mg total) by mouth 3 (three) times daily as needed.   fluticasone (FLONASE) 50 MCG/ACT nasal spray Place 2 sprays into both nostrils daily.   ibuprofen (ADVIL) 200 MG tablet Take 200 mg by mouth every 6 (six) hours as needed.   tiZANidine (ZANAFLEX) 4 MG tablet One by mouth every night before bed as needed for spasm   traMADol (ULTRAM) 50 MG tablet Take 1 tablet (50 mg total) by mouth every 6 (six) hours as needed.   traZODone (DESYREL) 100 MG tablet Take 100 mg by mouth at bedtime.   No facility-administered encounter medications on file as of 07/23/2021.    Allergies (verified) Patient has no known allergies.   History: Past Medical History:  Diagnosis  Date   Anxiety    Back pain    Bronchitis    Depression    GERD (gastroesophageal reflux disease)    Neuromuscular disorder (HCC)    bilat carpal tunnel synd   Tobacco abuse 12/12/2016   Past Surgical History:  Procedure Laterality Date   TUBAL LIGATION  1990   Family History  Problem Relation Age of Onset   Heart disease Mother    Hyperlipidemia Mother    Hypertension Mother    Stroke Mother    Cancer Father        pancrease   Diabetes Father    Hearing loss Father    Cancer Sister        ovairan   Hepatitis C Sister    Cancer Paternal Grandmother        pancreatic cancer   Social History   Socioeconomic History   Marital status: Single    Spouse name: Not on file   Number of children: 1   Years of education: 12   Highest education level: Not on file  Occupational History   Occupation: Disabled- sciatica/back pain; approved 6-8 months ago (early 2022)  Tobacco Use   Smoking status: Every Day    Packs/day: 0.50    Years: 42.00    Pack years: 21.00    Types: Cigarettes    Start date: 10/31/1980   Smokeless tobacco: Never   Tobacco comments:    discussed  Vaping Use   Vaping Use: Former  Substance and  Sexual Activity   Alcohol use: Not Currently    Alcohol/week: 1.0 standard drink    Types: 1 Cans of beer per week    Comment: occassional   Drug use: No   Sexual activity: Not Currently  Other Topics Concern   Not on file  Social History Narrative   Lives alone; has son      Social Determinants of Health   Financial Resource Strain: Not on file  Food Insecurity: Not on file  Transportation Needs: Not on file  Physical Activity: Not on file  Stress: Not on file  Social Connections: Not on file    Tobacco Counseling Ready to quit: Not Answered Counseling given: Not Answered Tobacco comments: discussed   Clinical Intake:                 Diabetic?no         Activities of Daily Living No flowsheet data found.  Patient Care  Team: Heather Roberts, NP as PCP - General (Nurse Practitioner)  Indicate any recent Medical Services you may have received from other than [facility] providers in the past year (date may be approximate).     Assessment:   This is a routine wellness examination for Whitney Powell.  Hearing/Vision screen No results found.  Dietary issues and exercise activities discussed:     Goals Addressed   None   Depression Screen PHQ 2/9 Scores 06/29/2021 11/30/2017 07/21/2017 12/14/2016 12/12/2016  PHQ - 2 Score 0 6 0 0 0  PHQ- 9 Score - 21 - - -    Fall Risk Fall Risk  06/29/2021 12/14/2016 12/12/2016  Falls in the past year? 1 No No  Number falls in past yr: 1 - -  Injury with Fall? 0 - -  Risk for fall due to : Impaired balance/gait - -  Follow up Falls evaluation completed - -    FALL RISK PREVENTION PERTAINING TO THE HOME:  Any stairs in or around the home? No  If so, are there any without handrails? No  Home free of loose throw rugs in walkways, pet beds, electrical cords, etc? Yes  Adequate lighting in your home to reduce risk of falls? Yes   ASSISTIVE DEVICES UTILIZED TO PREVENT FALLS:  Life alert? No  Use of a cane, walker or w/c? Yes  Grab bars in the bathroom? No  Shower chair or bench in shower? No  Elevated toilet seat or a handicapped toilet? No   TIMED UP AND GO:  Was the test performed? No .  Length of time to ambulate 10 feet: NA sec.     Cognitive Function:        Immunizations Immunization History  Administered Date(s) Administered   Influenza,inj,Quad PF,6+ Mos 12/12/2016   Influenza,trivalent, recombinat, inj, PF 10/05/2017    TDAP status: Due, Education has been provided regarding the importance of this vaccine. Advised may receive this vaccine at local pharmacy or Health Dept. Aware to provide a copy of the vaccination record if obtained from local pharmacy or Health Dept. Verbalized acceptance and understanding.  Flu Vaccine status: Due, Education has been  provided regarding the importance of this vaccine. Advised may receive this vaccine at local pharmacy or Health Dept. Aware to provide a copy of the vaccination record if obtained from local pharmacy or Health Dept. Verbalized acceptance and understanding.    Covid-19 vaccine status: Information provided on how to obtain vaccines.   Qualifies for Shingles Vaccine? Yes   Zostavax completed No  Shingrix Completed?: No.    Education has been provided regarding the importance of this vaccine. Patient has been advised to call insurance company to determine out of pocket expense if they have not yet received this vaccine. Advised may also receive vaccine at local pharmacy or Health Dept. Verbalized acceptance and understanding.  Screening Tests Health Maintenance  Topic Date Due   COVID-19 Vaccine (1) Never done   HIV Screening  Never done   COLONOSCOPY (Pts 45-58yrs Insurance coverage will need to be confirmed)  Never done   Zoster Vaccines- Shingrix (1 of 2) Never done   INFLUENZA VACCINE  05/31/2021   TETANUS/TDAP  06/29/2022 (Originally 07/19/1987)   PAP SMEAR-Modifier  02/19/2023   MAMMOGRAM  03/02/2023   Hepatitis C Screening  Completed   HPV VACCINES  Aged Out    Health Maintenance  Health Maintenance Due  Topic Date Due   COVID-19 Vaccine (1) Never done   HIV Screening  Never done   COLONOSCOPY (Pts 45-61yrs Insurance coverage will need to be confirmed)  Never done   Zoster Vaccines- Shingrix (1 of 2) Never done   INFLUENZA VACCINE  05/31/2021    Colorectal cancer screening: Type of screening: Colonoscopy. Completed 2022. Repeat every 10 years  Mammogram status: Completed 2022. Repeat every year    Lung Cancer Screening: (Low Dose CT Chest recommended if Age 41-80 years, 30 pack-year currently smoking OR have quit w/in 15years.) does not qualify.   Lung Cancer Screening Referral: NA  Additional Screening:  Hepatitis C Screening: does qualify; Completed   Vision  Screening: Recommended annual ophthalmology exams for early detection of glaucoma and other disorders of the eye. Is the patient up to date with their annual eye exam?  No  Who is the provider or what is the name of the office in which the patient attends annual eye exams? Will make an appt If pt is not established with a provider, would they like to be referred to a provider to establish care? No .   Dental Screening: Recommended annual dental exams for proper oral hygiene  Community Resource Referral / Chronic Care Management: CRR required this visit?  No   CCM required this visit?  No      Plan:     I have personally reviewed and noted the following in the patient's chart:   Medical and social history Use of alcohol, tobacco or illicit drugs  Current medications and supplements including opioid prescriptions.  Functional ability and status Nutritional status Physical activity Advanced directives List of other physicians Hospitalizations, surgeries, and ER visits in previous 12 months Vitals Screenings to include cognitive, depression, and falls Referrals and appointments  In addition, I have reviewed and discussed with patient certain preventive protocols, quality metrics, and best practice recommendations. A written personalized care plan for preventive services as well as general preventive health recommendations were provided to patient.     Park Breed, CMA   07/23/2021   Nurse Notes: This was telehealth visit. The patient was at home. The Provider was in the office and was Trena Platt, MD.

## 2021-07-23 NOTE — Patient Instructions (Signed)
Whitney Powell , Thank you for taking time to come for your Medicare Wellness Visit. I appreciate your ongoing commitment to your health goals. Please review the following plan we discussed and let me know if I can assist you in the future.   Screening recommendations/referrals:  Mammogram: Completed Recommended yearly ophthalmology/optometry visit for glaucoma screening and checkup Recommended yearly dental visit for hygiene and checkup  Vaccinations: Influenza vaccine: Due now  Tdap vaccine: due now Shingles vaccine: due now     Advanced directives: information provided   Next appointment: 1 year  Preventive Care 40-64 Years, Female Preventive care refers to lifestyle choices and visits with your health care provider that can promote health and wellness. What does preventive care include? A yearly physical exam. This is also called an annual well check. Dental exams once or twice a year. Routine eye exams. Ask your health care provider how often you should have your eyes checked. Personal lifestyle choices, including: Daily care of your teeth and gums. Regular physical activity. Eating a healthy diet. Avoiding tobacco and drug use. Limiting alcohol use. Practicing safe sex. Taking low-dose aspirin daily starting at age 63. Taking vitamin and mineral supplements as recommended by your health care provider. What happens during an annual well check? The services and screenings done by your health care provider during your annual well check will depend on your age, overall health, lifestyle risk factors, and family history of disease. Counseling  Your health care provider may ask you questions about your: Alcohol use. Tobacco use. Drug use. Emotional well-being. Home and relationship well-being. Sexual activity. Eating habits. Work and work Statistician. Method of birth control. Menstrual cycle. Pregnancy history. Screening  You may have the following tests or  measurements: Height, weight, and BMI. Blood pressure. Lipid and cholesterol levels. These may be checked every 5 years, or more frequently if you are over 68 years old. Skin check. Lung cancer screening. You may have this screening every year starting at age 16 if you have a 30-pack-year history of smoking and currently smoke or have quit within the past 15 years. Fecal occult blood test (FOBT) of the stool. You may have this test every year starting at age 76. Flexible sigmoidoscopy or colonoscopy. You may have a sigmoidoscopy every 5 years or a colonoscopy every 10 years starting at age 70. Hepatitis C blood test. Hepatitis B blood test. Sexually transmitted disease (STD) testing. Diabetes screening. This is done by checking your blood sugar (glucose) after you have not eaten for a while (fasting). You may have this done every 1-3 years. Mammogram. This may be done every 1-2 years. Talk to your health care provider about when you should start having regular mammograms. This may depend on whether you have a family history of breast cancer. BRCA-related cancer screening. This may be done if you have a family history of breast, ovarian, tubal, or peritoneal cancers. Pelvic exam and Pap test. This may be done every 3 years starting at age 43. Starting at age 73, this may be done every 5 years if you have a Pap test in combination with an HPV test. Bone density scan. This is done to screen for osteoporosis. You may have this scan if you are at high risk for osteoporosis. Discuss your test results, treatment options, and if necessary, the need for more tests with your health care provider. Vaccines  Your health care provider may recommend certain vaccines, such as: Influenza vaccine. This is recommended every year. Tetanus, diphtheria, and  acellular pertussis (Tdap, Td) vaccine. You may need a Td booster every 10 years. Zoster vaccine. You may need this after age 23. Pneumococcal 13-valent  conjugate (PCV13) vaccine. You may need this if you have certain conditions and were not previously vaccinated. Pneumococcal polysaccharide (PPSV23) vaccine. You may need one or two doses if you smoke cigarettes or if you have certain conditions. Talk to your health care provider about which screenings and vaccines you need and how often you need them. This information is not intended to replace advice given to you by your health care provider. Make sure you discuss any questions you have with your health care provider. Document Released: 11/13/2015 Document Revised: 07/06/2016 Document Reviewed: 08/18/2015 Elsevier Interactive Patient Education  2017 [place] Prevention in the Home Falls can cause injuries. They can happen to people of all ages. There are many things you can do to make your home safe and to help prevent falls. What can I do on the outside of my home? Regularly fix the edges of walkways and driveways and fix any cracks. Remove anything that might make you trip as you walk through a door, such as a raised step or threshold. Trim any bushes or trees on the path to your home. Use bright outdoor lighting. Clear any walking paths of anything that might make someone trip, such as rocks or tools. Regularly check to see if handrails are loose or broken. Make sure that both sides of any steps have handrails. Any raised decks and porches should have guardrails on the edges. Have any leaves, snow, or ice cleared regularly. Use sand or salt on walking paths during winter. Clean up any spills in your garage right away. This includes oil or grease spills. What can I do in the bathroom? Use night lights. Install grab bars by the toilet and in the tub and shower. Do not use towel bars as grab bars. Use non-skid mats or decals in the tub or shower. If you need to sit down in the shower, use a plastic, non-slip stool. Keep the floor dry. Clean up any water that spills on the  floor as soon as it happens. Remove soap buildup in the tub or shower regularly. Attach bath mats securely with double-sided non-slip rug tape. Do not have throw rugs and other things on the floor that can make you trip. What can I do in the bedroom? Use night lights. Make sure that you have a light by your bed that is easy to reach. Do not use any sheets or blankets that are too big for your bed. They should not hang down onto the floor. Have a firm chair that has side arms. You can use this for support while you get dressed. Do not have throw rugs and other things on the floor that can make you trip. What can I do in the kitchen? Clean up any spills right away. Avoid walking on wet floors. Keep items that you use a lot in easy-to-reach places. If you need to reach something above you, use a strong step stool that has a grab bar. Keep electrical cords out of the way. Do not use floor polish or wax that makes floors slippery. If you must use wax, use non-skid floor wax. Do not have throw rugs and other things on the floor that can make you trip. What can I do with my stairs? Do not leave any items on the stairs. Make sure that there are handrails  on both sides of the stairs and use them. Fix handrails that are broken or loose. Make sure that handrails are as long as the stairways. Check any carpeting to make sure that it is firmly attached to the stairs. Fix any carpet that is loose or worn. Avoid having throw rugs at the top or bottom of the stairs. If you do have throw rugs, attach them to the floor with carpet tape. Make sure that you have a light switch at the top of the stairs and the bottom of the stairs. If you do not have them, ask someone to add them for you. What else can I do to help prevent falls? Wear shoes that: Do not have high heels. Have rubber bottoms. Are comfortable and fit you well. Are closed at the toe. Do not wear sandals. If you use a stepladder: Make sure that  it is fully opened. Do not climb a closed stepladder. Make sure that both sides of the stepladder are locked into place. Ask someone to hold it for you, if possible. Clearly mark and make sure that you can see: Any grab bars or handrails. First and last steps. Where the edge of each step is. Use tools that help you move around (mobility aids) if they are needed. These include: Canes. Walkers. Scooters. Crutches. Turn on the lights when you go into a dark area. Replace any light bulbs as soon as they burn out. Set up your furniture so you have a clear path. Avoid moving your furniture around. If any of your floors are uneven, fix them. If there are any pets around you, be aware of where they are. Review your medicines with your doctor. Some medicines can make you feel dizzy. This can increase your chance of falling. Ask your doctor what other things that you can do to help prevent falls. This information is not intended to replace advice given to you by your health care provider. Make sure you discuss any questions you have with your health care provider. Document Released: 08/13/2009 Document Revised: 03/24/2016 Document Reviewed: 11/21/2014 Elsevier Interactive Patient Education  2017 [employer].

## 2021-07-23 NOTE — Progress Notes (Signed)
 Subjective:   Jennafer Stargell is a 55 y.o. female who presents for Medicare Annual (Subsequent) preventive examination. I connected with  Carrin Butterbaugh on 07/23/21 by an audio enabled telemedicine application and verified that I am speaking with the correct person using two identifiers.   I discussed the limitations, risks, security and privacy concerns of performing an evaluation and management service by telephone and the availability of in person appointments. I also discussed with the patient that there may be a patient responsible charge related to this service. The patient expressed understanding and verbally consented to this telephonic visit.  Review of Systems           Objective:    There were no vitals filed for this visit. There is no height or weight on file to calculate BMI.  Advanced Directives 12/17/2018 11/13/2018 10/02/2018  Does Patient Have a Medical Advance Directive? No No No  Would patient like information on creating a medical advance directive? - No - Patient declined No - Patient declined    Current Medications (verified) Outpatient Encounter Medications as of 07/23/2021  Medication Sig   citalopram (CELEXA) 20 MG tablet Take 20 mg by mouth daily.   cyclobenzaprine (FLEXERIL) 5 MG tablet Take 1 tablet (5 mg total) by mouth 3 (three) times daily as needed.   fluticasone (FLONASE) 50 MCG/ACT nasal spray Place 2 sprays into both nostrils daily.   ibuprofen (ADVIL) 200 MG tablet Take 200 mg by mouth every 6 (six) hours as needed.   tiZANidine (ZANAFLEX) 4 MG tablet One by mouth every night before bed as needed for spasm   traMADol (ULTRAM) 50 MG tablet Take 1 tablet (50 mg total) by mouth every 6 (six) hours as needed.   traZODone (DESYREL) 100 MG tablet Take 100 mg by mouth at bedtime.   No facility-administered encounter medications on file as of 07/23/2021.    Allergies (verified) Patient has no known allergies.   History: Past Medical History:  Diagnosis  Date   Anxiety    Back pain    Bronchitis    Depression    GERD (gastroesophageal reflux disease)    Neuromuscular disorder (HCC)    bilat carpal tunnel synd   Tobacco abuse 12/12/2016   Past Surgical History:  Procedure Laterality Date   TUBAL LIGATION  1990   Family History  Problem Relation Age of Onset   Heart disease Mother    Hyperlipidemia Mother    Hypertension Mother    Stroke Mother    Cancer Father        pancrease   Diabetes Father    Hearing loss Father    Cancer Sister        ovairan   Hepatitis C Sister    Cancer Paternal Grandmother        pancreatic cancer   Social History   Socioeconomic History   Marital status: Single    Spouse name: Not on file   Number of children: 1   Years of education: 12   Highest education level: Not on file  Occupational History   Occupation: Disabled- sciatica/back pain; approved 6-8 months ago (early 2022)  Tobacco Use   Smoking status: Every Day    Packs/day: 0.50    Years: 42.00    Pack years: 21.00    Types: Cigarettes    Start date: 10/31/1980   Smokeless tobacco: Never   Tobacco comments:    discussed  Vaping Use   Vaping Use: Former  Substance and   Sexual Activity   Alcohol use: Not Currently    Alcohol/week: 1.0 standard drink    Types: 1 Cans of beer per week    Comment: occassional   Drug use: No   Sexual activity: Not Currently  Other Topics Concern   Not on file  Social History Narrative   Lives alone; has son      Social Determinants of Health   Financial Resource Strain: Not on file  Food Insecurity: Not on file  Transportation Needs: Not on file  Physical Activity: Not on file  Stress: Not on file  Social Connections: Not on file    Tobacco Counseling Ready to quit: Not Answered Counseling given: Not Answered Tobacco comments: discussed   Clinical Intake:                 Diabetic?no         Activities of Daily Living No flowsheet data found.  Patient Care  Team: Gray, Joseph M, NP as PCP - General (Nurse Practitioner)  Indicate any recent Medical Services you may have received from other than [facility] providers in the past year (date may be approximate).     Assessment:   This is a routine wellness examination for Hera.  Hearing/Vision screen No results found.  Dietary issues and exercise activities discussed:     Goals Addressed   None   Depression Screen PHQ 2/9 Scores 06/29/2021 11/30/2017 07/21/2017 12/14/2016 12/12/2016  PHQ - 2 Score 0 6 0 0 0  PHQ- 9 Score - 21 - - -    Fall Risk Fall Risk  06/29/2021 12/14/2016 12/12/2016  Falls in the past year? 1 No No  Number falls in past yr: 1 - -  Injury with Fall? 0 - -  Risk for fall due to : Impaired balance/gait - -  Follow up Falls evaluation completed - -    FALL RISK PREVENTION PERTAINING TO THE HOME:  Any stairs in or around the home? No  If so, are there any without handrails? No  Home free of loose throw rugs in walkways, pet beds, electrical cords, etc? Yes  Adequate lighting in your home to reduce risk of falls? Yes   ASSISTIVE DEVICES UTILIZED TO PREVENT FALLS:  Life alert? No  Use of a cane, walker or w/c? Yes  Grab bars in the bathroom? No  Shower chair or bench in shower? No  Elevated toilet seat or a handicapped toilet? No   TIMED UP AND GO:  Was the test performed? No .  Length of time to ambulate 10 feet: NA sec.     Cognitive Function:        Immunizations Immunization History  Administered Date(s) Administered   Influenza,inj,Quad PF,6+ Mos 12/12/2016   Influenza,trivalent, recombinat, inj, PF 10/05/2017    TDAP status: Due, Education has been provided regarding the importance of this vaccine. Advised may receive this vaccine at local pharmacy or Health Dept. Aware to provide a copy of the vaccination record if obtained from local pharmacy or Health Dept. Verbalized acceptance and understanding.  Flu Vaccine status: Due, Education has been  provided regarding the importance of this vaccine. Advised may receive this vaccine at local pharmacy or Health Dept. Aware to provide a copy of the vaccination record if obtained from local pharmacy or Health Dept. Verbalized acceptance and understanding.    Covid-19 vaccine status: Information provided on how to obtain vaccines.   Qualifies for Shingles Vaccine? Yes   Zostavax completed No     Shingrix Completed?: No.    Education has been provided regarding the importance of this vaccine. Patient has been advised to call insurance company to determine out of pocket expense if they have not yet received this vaccine. Advised may also receive vaccine at local pharmacy or Health Dept. Verbalized acceptance and understanding.  Screening Tests Health Maintenance  Topic Date Due   COVID-19 Vaccine (1) Never done   HIV Screening  Never done   COLONOSCOPY (Pts 45-49yrs Insurance coverage will need to be confirmed)  Never done   Zoster Vaccines- Shingrix (1 of 2) Never done   INFLUENZA VACCINE  05/31/2021   TETANUS/TDAP  06/29/2022 (Originally 07/19/1987)   PAP SMEAR-Modifier  02/19/2023   MAMMOGRAM  03/02/2023   Hepatitis C Screening  Completed   HPV VACCINES  Aged Out    Health Maintenance  Health Maintenance Due  Topic Date Due   COVID-19 Vaccine (1) Never done   HIV Screening  Never done   COLONOSCOPY (Pts 45-49yrs Insurance coverage will need to be confirmed)  Never done   Zoster Vaccines- Shingrix (1 of 2) Never done   INFLUENZA VACCINE  05/31/2021    Colorectal cancer screening: Type of screening: Colonoscopy. Completed 2022. Repeat every 10 years  Mammogram status: Completed 2022. Repeat every year    Lung Cancer Screening: (Low Dose CT Chest recommended if Age 55-80 years, 30 pack-year currently smoking OR have quit w/in 15years.) does not qualify.   Lung Cancer Screening Referral: NA  Additional Screening:  Hepatitis C Screening: does qualify; Completed   Vision  Screening: Recommended annual ophthalmology exams for early detection of glaucoma and other disorders of the eye. Is the patient up to date with their annual eye exam?  No  Who is the provider or what is the name of the office in which the patient attends annual eye exams? Will make an appt If pt is not established with a provider, would they like to be referred to a provider to establish care? No .   Dental Screening: Recommended annual dental exams for proper oral hygiene  Community Resource Referral / Chronic Care Management: CRR required this visit?  No   CCM required this visit?  No      Plan:     I have personally reviewed and noted the following in the patient's chart:   Medical and social history Use of alcohol, tobacco or illicit drugs  Current medications and supplements including opioid prescriptions.  Functional ability and status Nutritional status Physical activity Advanced directives List of other physicians Hospitalizations, surgeries, and ER visits in previous 12 months Vitals Screenings to include cognitive, depression, and falls Referrals and appointments  In addition, I have reviewed and discussed with patient certain preventive protocols, quality metrics, and best practice recommendations. A written personalized care plan for preventive services as well as general preventive health recommendations were provided to patient.     Jaynee Winters R Loye Vento, CMA   07/23/2021   Nurse Notes: This was telehealth visit. The patient was at home. The Provider was in the office and was Rutwik Patel, MD.      

## 2021-08-05 ENCOUNTER — Ambulatory Visit: Payer: Medicare Other | Admitting: Specialist

## 2021-08-11 ENCOUNTER — Other Ambulatory Visit: Payer: Self-pay | Admitting: Specialist

## 2021-08-11 MED ORDER — TRAMADOL HCL 50 MG PO TABS: 50.0000 mg | ORAL_TABLET | Freq: Four times a day (QID) | ORAL | 0 refills | Status: DC | PRN

## 2021-08-11 NOTE — Telephone Encounter (Signed)
Pt called requesting a refill tramadol. Please send to pharmacy on file. Pt phone number is [phone].

## 2021-08-12 ENCOUNTER — Ambulatory Visit: Payer: Medicare Other | Admitting: Specialist

## 2021-08-17 ENCOUNTER — Telehealth: Payer: Self-pay | Admitting: Nurse Practitioner

## 2021-08-17 NOTE — Telephone Encounter (Signed)
   Telephone encounter was:  Successful.  08/17/2021 Name: Whitney Powell MRN: [id number] DOB: 19-Aug-1966  Whitney Powell is a 55 y.o. year old female who is a primary care patient of Heather Roberts, NP . The community resource team was consulted for assistance with Transportation Needs  and finding a dentist.  Care guide performed the following interventions: Discussed resources to assist with Medicaid Transportation. Gave patient the number for [facility] to assist with transportation as well as three dentists in her area that accept Medicaid .  Follow Up Plan:  No further follow up planned at this time. The patient has been provided with needed resources.  April Green Care Guide, Embedded Care Coordination [facility], Care Management Phone: [phone] Email: [email or web address]

## 2021-08-20 ENCOUNTER — Ambulatory Visit: Payer: Medicare Other | Admitting: Nurse Practitioner

## 2021-08-30 ENCOUNTER — Telehealth: Payer: Self-pay | Admitting: Specialist

## 2021-09-09 ENCOUNTER — Encounter: Payer: Self-pay | Admitting: Specialist

## 2021-09-09 ENCOUNTER — Other Ambulatory Visit: Payer: Self-pay

## 2021-09-09 ENCOUNTER — Ambulatory Visit (INDEPENDENT_AMBULATORY_CARE_PROVIDER_SITE_OTHER): Payer: Medicare Other | Admitting: Specialist

## 2021-09-09 VITALS — BP 102/71 | HR 83 | Ht 65.0 in | Wt 172.0 lb

## 2021-09-09 DIAGNOSIS — M4807 Spinal stenosis, lumbosacral region: Secondary | ICD-10-CM | POA: Diagnosis not present

## 2021-09-09 DIAGNOSIS — M545 Low back pain, unspecified: Secondary | ICD-10-CM | POA: Diagnosis not present

## 2021-09-09 DIAGNOSIS — G894 Chronic pain syndrome: Secondary | ICD-10-CM | POA: Diagnosis not present

## 2021-09-09 DIAGNOSIS — M48062 Spinal stenosis, lumbar region with neurogenic claudication: Secondary | ICD-10-CM | POA: Diagnosis not present

## 2021-09-09 MED ORDER — TRAMADOL HCL 50 MG PO TABS: 50.0000 mg | ORAL_TABLET | Freq: Four times a day (QID) | ORAL | 0 refills | Status: DC | PRN

## 2021-09-09 NOTE — Progress Notes (Signed)
Office Visit Note   Patient: Whitney Powell           Date of Birth: May 13, 1966           MRN: [id number] Visit Date: 09/09/2021              Requested by: Heather Roberts, NP [address]  [address] [place],  [place] [address] PCP: Heather Roberts, NP   Assessment & Plan: Visit Diagnoses:  1. Spinal stenosis of lumbar region with neurogenic claudication   2. Chronic pain syndrome   3. Low back pain, unspecified back pain laterality, unspecified chronicity, unspecified whether sciatica present   4. Spinal stenosis of lumbosacral region      Plan: Avoid bending, stooping and avoid lifting weights greater than 10 lbs. Avoid prolong standing and walking. Order for a new walker with wheels. Surgery scheduling secretary Tivis Ringer, will call you in the next week to schedule for surgery.  Surgery recommended is a three level lumbar decompressive laminectomy L3-4, L4-5 with foramenotomies at L5-S1 this would be done with the microscope.  Take hydrocodone for for pain. Risk of surgery includes risk of infection 1 in 200 patients, bleeding 1/2% chance you would need a transfusion.   Risk to the nerves is one in 10,000.   Expect improved walking and standing tolerance. Expect relief of leg pain but numbness may persist depending on the length and degree of pressure that has been present. Tramadol for pain and flexeril. Last MRI was done 02/2020 so needs a new study to confirm the pathology and schedule following that study as she is not gaining improvement with the ESIs though they help it  Is only temporary.  Follow-Up Instructions: No follow-ups on file.   Orders:  No orders of the defined types were placed in this encounter.  No orders of the defined types were placed in this encounter.     Procedures: No procedures performed   Clinical Data: No additional findings.   Subjective: Chief Complaint  Patient presents with   Lower Back - Follow-up    55 year old  female with a classic history of lumbar spinal stenosis and neurogenic claudication symptoms. Pain with standing and walking Unable to walk to mail box and had to sit down at the half way point. No bowel or bladder difficulties. Does have pain with cough and sneeze, mostly pain into the left greater than right buttock and down the legs. She reports turning her right ankle trying to walk. Had some bruising across the right dorsal foot.    Review of Systems  Constitutional: Negative.   HENT: Negative.    Eyes: Negative.   Respiratory: Negative.    Cardiovascular: Negative.   Gastrointestinal: Negative.   Endocrine: Negative.   Genitourinary: Negative.   Musculoskeletal: Negative.   Skin: Negative.   Allergic/Immunologic: Negative.   Neurological: Negative.   Hematological: Negative.   Psychiatric/Behavioral: Negative.      Objective: Vital Signs: BP 102/71 (BP Location: Left Arm, Patient Position: Sitting)   Pulse 83   Ht 5\' 5"  (1.651 m)   Wt 172 lb (78 kg)   LMP 12/12/2010 (Approximate)   BMI 28.62 kg/m   Physical Exam Constitutional:      Appearance: She is well-developed.  HENT:     Head: Normocephalic and atraumatic.  Eyes:     Pupils: Pupils are equal, round, and reactive to light.  Pulmonary:     Effort: Pulmonary effort is normal.  Breath sounds: Normal breath sounds.  Abdominal:     General: Bowel sounds are normal.     Palpations: Abdomen is soft.  Musculoskeletal:     Cervical back: Normal range of motion and neck supple.     Lumbar back: Negative right straight leg raise test and negative left straight leg raise test.  Skin:    General: Skin is warm and dry.  Neurological:     Mental Status: She is alert and oriented to person, place, and time.  Psychiatric:        Behavior: Behavior normal.        Thought Content: Thought content normal.        Judgment: Judgment normal.   Back Exam   Tenderness  The patient is experiencing tenderness in the  lumbar.  Range of Motion  Extension:  abnormal  Flexion:  normal  Lateral bend right:  normal  Lateral bend left:  normal  Rotation right:  normal  Rotation left:  normal   Muscle Strength  Right Quadriceps:  5/5  Left Quadriceps:  5/5  Right Hamstrings:  5/5  Left Hamstrings:  5/5   Tests  Straight leg raise right: negative Straight leg raise left: negative  Reflexes  Patellar:  0/4 Achilles:  0/4  Other  Toe walk: normal Heel walk: normal Sensation: normal Gait: normal     Specialty Comments:  No specialty comments available.  Imaging: No results found.   PMFS History: Patient Active Problem List   Diagnosis Date Noted   Bilateral acute serous otitis media 06/29/2021   Encounter to establish care 06/29/2021   Assistance needed with transportation 06/29/2021   Sciatica 06/29/2021   Insomnia 06/29/2021   GERD (gastroesophageal reflux disease)    Auditory hallucination 11/30/2017   Anxiety and depression 01/02/2017   Alcohol abuse 01/02/2017   Attention deficit disorder (ADD) 01/02/2017   CTS (carpal tunnel syndrome) 12/12/2016   Tobacco abuse 12/12/2016   Chronic hepatitis C without hepatic coma (HCC) 12/12/2016   Past Medical History:  Diagnosis Date   Anxiety    Back pain    Bronchitis    Depression    GERD (gastroesophageal reflux disease)    Neuromuscular disorder (HCC)    bilat carpal tunnel synd   Tobacco abuse 12/12/2016    Family History  Problem Relation Age of Onset   Heart disease Mother    Hyperlipidemia Mother    Hypertension Mother    Stroke Mother    Cancer Father        pancrease   Diabetes Father    Hearing loss Father    Cancer Sister        ovairan   Hepatitis C Sister    Cancer Paternal Grandmother        pancreatic cancer    Past Surgical History:  Procedure Laterality Date   TUBAL LIGATION  1990   Social History   Occupational History   Occupation: Disabled- sciatica/back pain; approved 6-8 months ago  (early 2022)  Tobacco Use   Smoking status: Every Day    Packs/day: 0.50    Years: 42.00    Pack years: 21.00    Types: Cigarettes    Start date: 10/31/1980   Smokeless tobacco: Never   Tobacco comments:    discussed  Vaping Use   Vaping Use: Former  Substance and Sexual Activity   Alcohol use: Not Currently    Alcohol/week: 1.0 standard drink    Types: 1 Cans of beer per  week    Comment: occassional   Drug use: No   Sexual activity: Not Currently

## 2021-09-09 NOTE — Patient Instructions (Addendum)
Plan: Avoid bending, stooping and avoid lifting weights greater than 10 lbs. Avoid prolong standing and walking. Order for a new walker with wheels. Surgery scheduling secretary Tivis Ringer, will call you in the next week to schedule for surgery.  Surgery recommended is a three level lumbar decompressive laminectomy L3-4, L4-5 with foramenotomies at L5-S1 this would be done with the microscope.  Take hydrocodone for for pain. Risk of surgery includes risk of infection 1 in 200 patients, bleeding 1/2% chance you would need a transfusion.   Risk to the nerves is one in 10,000.   Expect improved walking and standing tolerance. Expect relief of leg pain but numbness may persist depending on the length and degree of pressure that has been present. Tramadol for pain and flexeril. Last MRI was done 02/2020 so needs a new study to confirm the pathology and schedule following that study as she is not gaining improvement with the ESIs though they help it  Is only temporary. We will examine the MRI and call with the report.

## 2021-09-22 DIAGNOSIS — F329 Major depressive disorder, single episode, unspecified: Secondary | ICD-10-CM | POA: Diagnosis not present

## 2021-09-30 ENCOUNTER — Other Ambulatory Visit: Payer: Self-pay | Admitting: Specialist

## 2021-09-30 NOTE — Telephone Encounter (Signed)
Pt called requesting a refill of her steroid medication. Please send to pharmacy on file. Pt phone number is [phone].

## 2021-10-01 ENCOUNTER — Other Ambulatory Visit: Payer: Self-pay | Admitting: Specialist

## 2021-10-01 NOTE — Telephone Encounter (Signed)
When speaking with Dr. Otelia Sergeant he did not want her a dose pak if she was getting surgery, I called and advised patient of this. And it looks like Loran Senters gave her the last rx 01/2020.

## 2021-10-05 ENCOUNTER — Other Ambulatory Visit: Payer: Medicare Other

## 2021-10-19 ENCOUNTER — Other Ambulatory Visit: Payer: Medicare Other

## 2021-11-19 ENCOUNTER — Other Ambulatory Visit: Payer: Medicare Other

## 2021-11-21 ENCOUNTER — Other Ambulatory Visit: Payer: Medicare Other

## 2021-11-22 ENCOUNTER — Encounter: Payer: Medicaid Other | Admitting: Nurse Practitioner

## 2021-12-14 ENCOUNTER — Ambulatory Visit (INDEPENDENT_AMBULATORY_CARE_PROVIDER_SITE_OTHER): Payer: Medicare Other | Admitting: Nurse Practitioner

## 2021-12-14 ENCOUNTER — Encounter: Payer: Self-pay | Admitting: Nurse Practitioner

## 2021-12-14 ENCOUNTER — Other Ambulatory Visit: Payer: Self-pay

## 2021-12-14 VITALS — BP 110/72 | HR 57 | Ht 65.0 in | Wt 160.1 lb

## 2021-12-14 DIAGNOSIS — Z0001 Encounter for general adult medical examination with abnormal findings: Secondary | ICD-10-CM | POA: Diagnosis not present

## 2021-12-14 DIAGNOSIS — Z720 Tobacco use: Secondary | ICD-10-CM

## 2021-12-14 DIAGNOSIS — Z0000 Encounter for general adult medical examination without abnormal findings: Secondary | ICD-10-CM | POA: Insufficient documentation

## 2021-12-14 DIAGNOSIS — F32A Depression, unspecified: Secondary | ICD-10-CM

## 2021-12-14 DIAGNOSIS — J0191 Acute recurrent sinusitis, unspecified: Secondary | ICD-10-CM

## 2021-12-14 DIAGNOSIS — Z23 Encounter for immunization: Secondary | ICD-10-CM | POA: Insufficient documentation

## 2021-12-14 DIAGNOSIS — F419 Anxiety disorder, unspecified: Secondary | ICD-10-CM

## 2021-12-14 DIAGNOSIS — R87610 Atypical squamous cells of undetermined significance on cytologic smear of cervix (ASC-US): Secondary | ICD-10-CM | POA: Diagnosis not present

## 2021-12-14 MED ORDER — FLUTICASONE PROPIONATE 50 MCG/ACT NA SUSP: 2.0000 | Freq: Every day | NASAL | 6 refills | Status: DC

## 2021-12-14 MED ORDER — CETIRIZINE HCL 10 MG PO TABS: 10.0000 mg | ORAL_TABLET | Freq: Every day | ORAL | 11 refills | Status: DC

## 2021-12-14 MED ORDER — BUPROPION HCL ER (SR) 150 MG PO TB12: 150.0000 mg | ORAL_TABLET | Freq: Two times a day (BID) | ORAL | 2 refills | Status: DC

## 2021-12-14 NOTE — Assessment & Plan Note (Signed)
RX zyrtec 10mg  daily Flonase nasal spray OTC cough med as needed for cough.

## 2021-12-14 NOTE — Assessment & Plan Note (Signed)
Seen on PAP done in 2021. Pt stated that she never followed up on this due to having issues with her back then, referral sent to OBGYN today.

## 2021-12-14 NOTE — Assessment & Plan Note (Signed)
Doing well celexa, continue med.

## 2021-12-14 NOTE — Assessment & Plan Note (Signed)
Ready to quit . RX bupropion 150mg  BID. Take bupropion  150mg  the first three days.  Follow up in 4 weeks.

## 2021-12-14 NOTE — Assessment & Plan Note (Signed)
Annual exam as documented.  ?Counseling done include healthy lifestyle involving committing to 150 minutes of exercise per week, heart healthy diet, and attaining healthy weight. The importance of adequate sleep also discussed.  ?Regular use of seat belt and home safety were also discussed . ?Changes in health habits are decided on by patient with goals and time frames set for achieving them. ?Immunization and cancer screening  needs are specifically addressed at this visit.   ?

## 2021-12-14 NOTE — Progress Notes (Signed)
Established Patient Office Visit  Subjective:  Patient ID: Whitney Powell, female    DOB: 29-Oct-1966  Age: 56 y.o. MRN: [id number]  CC:  Chief Complaint  Patient presents with   Annual Exam    Cpe     HPI Whitney Powell presents for annual physical exam  Pt stated that she has been smoking since age 51, she has smoked half pack to one pack a day since then. She has used nicotine patch in the past but it did not help. She would like med to assist with smoking cessation  She has an upcoming surgery  for her back.   Due for shingles vaccine, TDAP vaccine , will get flu vaccine in the office today.   Pt stated that she [facility]. Pt will sign a release of information form today to get her results from them.   Pt c/o non productive cough,stuffy nose ,  sneezing , running nose , sinus pressure, postnasal drip that started one week ago, she stated that her symptoms does feel better, she is no more having sinus pressure. She has been taking Flonase as needed. She denies fever, chills , body aches , wheezing , sob.    Past Medical History:  Diagnosis Date   Anxiety    Back pain    Bronchitis    Depression    GERD (gastroesophageal reflux disease)    Neuromuscular disorder (HCC)    bilat carpal tunnel synd   Tobacco abuse 12/12/2016    Past Surgical History:  Procedure Laterality Date   TUBAL LIGATION  1990    Family History  Problem Relation Age of Onset   Heart disease Mother    Hyperlipidemia Mother    Hypertension Mother    Stroke Mother    Cancer Father        pancrease   Diabetes Father    Hearing loss Father    Cancer Sister        ovairan   Hepatitis C Sister    Cancer Paternal Grandmother        pancreatic cancer    Social History   Socioeconomic History   Marital status: Single    Spouse name: Not on file   Number of children: 1   Years of education: 12   Highest education level: Not on file  Occupational  History   Occupation: Disabled- sciatica/back pain; approved 6-8 months ago (early 2022)  Tobacco Use   Smoking status: Every Day    Packs/day: 0.50    Years: 42.00    Pack years: 21.00    Types: Cigarettes    Start date: 10/31/1980   Smokeless tobacco: Never   Tobacco comments:    discussed  Vaping Use   Vaping Use: Former  Substance and Sexual Activity   Alcohol use: Not Currently    Alcohol/week: 1.0 standard drink    Types: 1 Cans of beer per week    Comment: occassional   Drug use: No   Sexual activity: Not Currently  Other Topics Concern   Not on file  Social History Narrative   Lives alone; has son      Social Determinants of Health   Financial Resource Strain: Low Risk    Difficulty of Paying Living Expenses: Not hard at all  Food Insecurity: No Food Insecurity   Worried About Charity fundraiser in the Last Year: Never true   [place] in the  Last Year: Never true  Transportation Needs: No Transportation Needs   Lack of Transportation (Medical): No   Lack of Transportation (Non-Medical): No  Physical Activity: Insufficiently Active   Days of Exercise per Week: 2 days   Minutes of Exercise per Session: 30 min  Stress: No Stress Concern Present   Feeling of Stress : Not at all  Social Connections: Socially Isolated   Frequency of Communication with Friends and Family: More than three times a week   Frequency of Social Gatherings with Friends and Family: More than three times a week   Attends Religious Services: Never   Marine scientist or Organizations: No   Attends Music therapist: Never   Marital Status: Never married  Human resources officer Violence: Not At Risk   Fear of Current or Ex-Partner: No   Emotionally Abused: No   Physically Abused: No   Sexually Abused: No    Outpatient Medications Prior to Visit  Medication Sig Dispense Refill   citalopram (CELEXA) 20 MG tablet Take 20 mg by mouth daily.     ibuprofen (ADVIL) 200 MG  tablet Take 200 mg by mouth every 6 (six) hours as needed.     traZODone (DESYREL) 100 MG tablet Take 100 mg by mouth at bedtime.     cyclobenzaprine (FLEXERIL) 5 MG tablet Take 1 tablet (5 mg total) by mouth 3 (three) times daily as needed. (Patient not taking: Reported on 12/14/2021) 45 tablet 0   fluticasone (FLONASE) 50 MCG/ACT nasal spray Place 2 sprays into both nostrils daily. (Patient not taking: Reported on 12/14/2021) 16 g 6   tiZANidine (ZANAFLEX) 4 MG tablet One by mouth every night before bed as needed for spasm (Patient not taking: Reported on 12/14/2021) 30 tablet 3   traMADol (ULTRAM) 50 MG tablet Take 1 tablet (50 mg total) by mouth every 6 (six) hours as needed. (Patient not taking: Reported on 12/14/2021) 30 tablet 0   No facility-administered medications prior to visit.    No Known Allergies  ROS Review of Systems  Constitutional: Negative.   HENT:  Positive for dental problem, postnasal drip, rhinorrhea, sinus pressure and sneezing.   Eyes: Negative.   Respiratory: Negative.    Cardiovascular: Negative.   Gastrointestinal: Negative.   Endocrine: Negative.   Genitourinary: Negative.   Musculoskeletal:  Positive for arthralgias.  Skin: Negative.   Allergic/Immunologic: Negative.   Neurological: Negative.   Hematological: Negative.   Psychiatric/Behavioral: Negative.       Objective:   Pt refused a breast exam today Physical Exam Constitutional:      General: She is not in acute distress.    Appearance: Normal appearance. She is normal weight. She is not ill-appearing, toxic-appearing or diaphoretic.  HENT:     Head: Normocephalic and atraumatic.     Comments: Missing teeth  from recent oral surgery, no sign of infection noted.     Right Ear: Tympanic membrane, ear canal and external ear normal. There is no impacted cerumen.     Left Ear: Tympanic membrane, ear canal and external ear normal. There is no impacted cerumen.     Nose: Nose normal. No congestion or  rhinorrhea.     Mouth/Throat:     Mouth: Mucous membranes are moist.     Pharynx: Oropharynx is clear. No oropharyngeal exudate or posterior oropharyngeal erythema.  Eyes:     General: No scleral icterus.       Right eye: No discharge.  Left eye: No discharge.     Extraocular Movements: Extraocular movements intact.     Conjunctiva/sclera: Conjunctivae normal.     Pupils: Pupils are equal, round, and reactive to light.  Neck:     Vascular: No carotid bruit.  Cardiovascular:     Rate and Rhythm: Normal rate and regular rhythm.     Pulses: Normal pulses.     Heart sounds: Normal heart sounds. No murmur heard.   No friction rub. No gallop.  Pulmonary:     Effort: Pulmonary effort is normal. No respiratory distress.     Breath sounds: Normal breath sounds. No stridor. No wheezing, rhonchi or rales.  Chest:     Chest wall: No tenderness.  Abdominal:     General: There is no distension.     Palpations: Abdomen is soft. There is no mass.     Tenderness: There is no abdominal tenderness. There is no right CVA tenderness, left CVA tenderness, guarding or rebound.     Hernia: No hernia is present.  Musculoskeletal:        General: No swelling, tenderness, deformity or signs of injury.     Cervical back: Normal range of motion and neck supple. No rigidity or tenderness.     Right lower leg: No edema.     Left lower leg: No edema.  Lymphadenopathy:     Cervical: No cervical adenopathy.  Skin:    General: Skin is warm and dry.     Capillary Refill: Capillary refill takes less than 2 seconds.     Coloration: Skin is not jaundiced or pale.     Findings: No bruising, erythema, lesion or rash.  Neurological:     Mental Status: She is alert and oriented to person, place, and time.     Cranial Nerves: No cranial nerve deficit.     Sensory: No sensory deficit.     Motor: No weakness.     Coordination: Coordination normal.     Gait: Gait normal.     Deep Tendon Reflexes: Reflexes  normal.  Psychiatric:        Mood and Affect: Mood normal.        Behavior: Behavior normal.        Thought Content: Thought content normal.        Judgment: Judgment normal.    BP 110/72 (BP Location: Right Arm, Patient Position: Sitting, Cuff Size: Normal)    Pulse (!) 57    Ht 5\' 5"  (1.651 m)    Wt 160 lb 1.3 oz (72.6 kg)    SpO2 97%    Breastfeeding No    BMI 26.64 kg/m  Wt Readings from Last 3 Encounters:  12/14/21 160 lb 1.3 oz (72.6 kg)  09/09/21 172 lb (78 kg)  07/01/21 172 lb (78 kg)     Health Maintenance Due  Topic Date Due   HIV Screening  Never done   COLONOSCOPY (Pts 45-58yrs Insurance coverage will need to be confirmed)  Never done   Zoster Vaccines- Shingrix (1 of 2) Never done   INFLUENZA VACCINE  05/31/2021    There are no preventive care reminders to display for this patient.  No results found for: TSH No results found for: WBC, HGB, HCT, MCV, PLT Lab Results  Component Value Date   NA 138 07/01/2019   K 4.0 07/01/2019   CO2 27 07/01/2019   GLUCOSE 95 07/01/2019   BUN 13 07/01/2019   CREATININE 0.75 07/01/2019   BILITOT 0.4 07/01/2019  ALKPHOS 75 07/01/2019   AST 20 07/01/2019   ALT 18 07/01/2019   PROT 8.0 07/01/2019   ALBUMIN 4.3 07/01/2019   CALCIUM 9.4 07/01/2019   ANIONGAP 8 07/01/2019   Lab Results  Component Value Date   CHOL 196 07/01/2019   Lab Results  Component Value Date   HDL 88 07/01/2019   Lab Results  Component Value Date   LDLCALC 99 07/01/2019   Lab Results  Component Value Date   TRIG 43 07/01/2019   Lab Results  Component Value Date   CHOLHDL 2.2 07/01/2019   No results found for: HGBA1C    Assessment & Plan:   Problem List Items Addressed This Visit   None   No orders of the defined types were placed in this encounter.   Follow-up: No follow-ups on file.    Renee Rival, FNP

## 2021-12-14 NOTE — Assessment & Plan Note (Signed)
Patient educated on CDC recommendation for the vaccine. Verbal consent was obtained from the patient, vaccine administered by nurse, no sign of adverse reactions noted at this time. Patient education on arm soreness and use of tylenol or ibuprofen (if safe) for this patient  was discussed. Patient educated on the signs and symptoms of adverse effect and advise to contact the office if they occur °

## 2021-12-14 NOTE — Patient Instructions (Addendum)
°  Please get your shingles vaccine, TDAP vaccine at your pharmacy.  Flu vaccine today.  Nurse Pleas [facility].  Take bupropion 150 mg once daily for 3 days, on day 4 start taking bupropion 150 mg twice daily for smoking cessation.   Take zyrtec 10mg  and flonase nasal spray  daily for your allergies   It is important that you exercise regularly at least 30 minutes 5 times a week.  Think about what you will eat, plan ahead. Choose " clean, green, fresh or frozen" over canned, processed or packaged foods which are more sugary, salty and fatty. 70 to 75% of food eaten should be vegetables and fruit. Three meals at set times with snacks allowed between meals, but they must be fruit or vegetables. Aim to eat over a 12 hour period , example 7 am to 7 pm, and STOP after  your last meal of the day. Drink water,generally about 64 ounces per day, no other drink is as healthy. Fruit juice is best enjoyed in a healthy way, by EATING the fruit.  Thanks for choosing [facility], we consider it a privelige to serve you.

## 2021-12-15 ENCOUNTER — Telehealth: Payer: Self-pay

## 2021-12-15 ENCOUNTER — Ambulatory Visit
Admission: RE | Admit: 2021-12-15 | Discharge: 2021-12-15 | Disposition: A | Discharge status: Home / Self Care | Payer: Medicare Other | Source: Ambulatory Visit | Attending: Specialist | Admitting: Specialist

## 2021-12-15 DIAGNOSIS — M4807 Spinal stenosis, lumbosacral region: Secondary | ICD-10-CM

## 2021-12-15 DIAGNOSIS — M545 Low back pain, unspecified: Secondary | ICD-10-CM | POA: Diagnosis not present

## 2021-12-15 DIAGNOSIS — Z0000 Encounter for general adult medical examination without abnormal findings: Secondary | ICD-10-CM | POA: Diagnosis not present

## 2021-12-15 DIAGNOSIS — R200 Anesthesia of skin: Secondary | ICD-10-CM | POA: Diagnosis not present

## 2021-12-15 DIAGNOSIS — M48061 Spinal stenosis, lumbar region without neurogenic claudication: Secondary | ICD-10-CM | POA: Diagnosis not present

## 2021-12-15 NOTE — Telephone Encounter (Signed)
[facility] called [place] about pap and colonoscopy. Pt has not had a colonoscopy done last pap is in epic done April of 2021

## 2021-12-15 NOTE — Telephone Encounter (Signed)
Called pt to let her know about below no answer left vm

## 2021-12-16 NOTE — Telephone Encounter (Signed)
Called pt no answer left vm 

## 2021-12-16 NOTE — Telephone Encounter (Signed)
Patient returning call call back # [phone]

## 2021-12-16 NOTE — Progress Notes (Signed)
Please review labs with patient.  LDL is elevated, Eat a healthy diet, including lots of fruits and vegetables. Avoid foods with a lot of saturated and trans fats, such as red meat, butter, fried foods and cheese . Maintain a healthy weight.  Other labs are normal. Thank you.

## 2021-12-16 NOTE — Telephone Encounter (Signed)
Spoke with pt advised of below pt verbalized understanding.

## 2021-12-17 ENCOUNTER — Other Ambulatory Visit: Payer: Medicare Other

## 2021-12-17 LAB — CMP14+EGFR
ALT: 10 IU/L (ref 0–32)
AST: 15 IU/L (ref 0–40)
Albumin/Globulin Ratio: 1.7 (ref 1.2–2.2)
Albumin: 4.5 g/dL (ref 3.8–4.9)
Alkaline Phosphatase: 89 IU/L (ref 44–121)
BUN/Creatinine Ratio: 11 (ref 9–23)
BUN: 7 mg/dL (ref 6–24)
Bilirubin Total: <0.2 mg/dL (ref 0.0–1.2)
CO2: 24 mmol/L (ref 20–29)
Calcium: 9.3 mg/dL (ref 8.7–10.2)
Chloride: 101 mmol/L (ref 96–106)
Creatinine, Ser: 0.61 mg/dL (ref 0.57–1.00)
Globulin, Total: 2.7 g/dL (ref 1.5–4.5)
Glucose: 78 mg/dL (ref 70–99)
Potassium: 4.2 mmol/L (ref 3.5–5.2)
Sodium: 138 mmol/L (ref 134–144)
Total Protein: 7.2 g/dL (ref 6.0–8.5)
eGFR: 107 mL/min/{1.73_m2} (ref >59)

## 2021-12-17 LAB — HEMOGLOBIN A1C
Est. average glucose Bld gHb Est-mCnc: 108 mg/dL
Hgb A1c MFr Bld: 5.4 % (ref 4.8–5.6)

## 2021-12-17 LAB — CBC WITH DIFFERENTIAL/PLATELET
Basophils Absolute: 0.1 10*3/uL (ref 0.0–0.2)
Basos: 1 %
EOS (ABSOLUTE): 0.3 10*3/uL (ref 0.0–0.4)
Eos: 4 %
Hematocrit: 45.5 % (ref 34.0–46.6)
Hemoglobin: 15.4 g/dL (ref 11.1–15.9)
Immature Grans (Abs): 0 10*3/uL (ref 0.0–0.1)
Immature Granulocytes: 1 %
Lymphocytes Absolute: 2 10*3/uL (ref 0.7–3.1)
Lymphs: 31 %
MCH: 31.8 pg (ref 26.6–33.0)
MCHC: 33.8 g/dL (ref 31.5–35.7)
MCV: 94 fL (ref 79–97)
Monocytes Absolute: 0.5 10*3/uL (ref 0.1–0.9)
Monocytes: 7 %
Neutrophils Absolute: 3.8 10*3/uL (ref 1.4–7.0)
Neutrophils: 56 %
Platelets: 333 10*3/uL (ref 150–450)
RBC: 4.85 x10E6/uL (ref 3.77–5.28)
RDW: 12.3 % (ref 11.7–15.4)
WBC: 6.7 10*3/uL (ref 3.4–10.8)

## 2021-12-17 LAB — LIPID PANEL
Chol/HDL Ratio: 3.2 ratio (ref 0.0–4.4)
Cholesterol, Total: 192 mg/dL (ref 100–199)
HDL: 60 mg/dL (ref >39)
LDL Chol Calc (NIH): 109 mg/dL — ABNORMAL HIGH (ref 0–99)
Triglycerides: 132 mg/dL (ref 0–149)
VLDL Cholesterol Cal: 23 mg/dL (ref 5–40)

## 2021-12-17 LAB — HIV ANTIBODY (ROUTINE TESTING W REFLEX): HIV Screen 4th Generation wRfx: NONREACTIVE

## 2021-12-17 LAB — TSH: TSH: 2.55 u[IU]/mL (ref 0.450–4.500)

## 2021-12-17 LAB — VITAMIN D 25 HYDROXY (VIT D DEFICIENCY, FRACTURES): Vit D, 25-Hydroxy: 10.6 ng/mL — ABNORMAL LOW (ref 30.0–100.0)

## 2021-12-28 ENCOUNTER — Other Ambulatory Visit: Payer: Self-pay

## 2021-12-28 ENCOUNTER — Encounter: Payer: Self-pay | Admitting: Nurse Practitioner

## 2021-12-28 ENCOUNTER — Ambulatory Visit (INDEPENDENT_AMBULATORY_CARE_PROVIDER_SITE_OTHER): Payer: Medicare Other | Admitting: Nurse Practitioner

## 2021-12-28 ENCOUNTER — Ambulatory Visit: Payer: Medicare Other

## 2021-12-28 DIAGNOSIS — R051 Acute cough: Secondary | ICD-10-CM | POA: Diagnosis not present

## 2021-12-28 DIAGNOSIS — R0602 Shortness of breath: Secondary | ICD-10-CM | POA: Diagnosis not present

## 2021-12-28 MED ORDER — ALBUTEROL SULFATE HFA 108 (90 BASE) MCG/ACT IN AERS: 2.0000 | INHALATION_SPRAY | Freq: Four times a day (QID) | RESPIRATORY_TRACT | 0 refills | Status: DC | PRN

## 2021-12-28 NOTE — Assessment & Plan Note (Signed)
SOB Current smoker, taking Wellbutrin for smoking cessation RX albuterol inhaler,  Inhale 2 puffs into the lungs every 6 (six) hours as needed for wheezing or shortness of breath Patient encouraged to continue to working  on smoking cessation. States that she is down to 3 cigarettes a day

## 2021-12-28 NOTE — Assessment & Plan Note (Signed)
Patient denies known sick contacts Pt will come to the office for flu and covid test.  Continue OTC cough medicine as needed.

## 2021-12-28 NOTE — Progress Notes (Signed)
Virtual Visit via Telephone Note  I connected with Whitney Powell @ on 12/28/21 at 1022by telephone and verified that I am speaking with the correct person using two identifiers.  I spent 8 minutes on this telephone encounter  Location: Patient: home Provider: office   I discussed the limitations, risks, security and privacy concerns of performing an evaluation and management service by telephone and the availability of in person appointments. I also discussed with the patient that there may be a patient responsible charge related to this service. The patient expressed understanding and agreed to proceed.   History of Present Illness: Pt c/o trouble breathing, cough that started since yesterday. Cough is non productive. She tried vicks it did not. Pt denies stuffy nose, sneezing, HA, CP, fever, wheezing. Pt denies known sick contact exposure. Pt has had flu vaccine, no Covid vaccine.  Does not want covid vaccine. Pt is a current smoker states that she is cutting down on smoking.   Observations/Objective:   Assessment and Plan: SOB Current smoker, taking Wellbutrin for smoking cessation RX albuterol inhaler,  Inhale 2 puffs into the lungs every 6 (six) hours as needed for wheezing or shortness of breath  ACUTE COUGH Patient denies known sick contacts Pt will come to the office for flu and covid test.  Continue OTC cough medicine as needed.  Follow Up Instructions:    I discussed the assessment and treatment plan with the patient. The patient was provided an opportunity to ask questions and all were answered. The patient agreed with the plan and demonstrated an understanding of the instructions.   The patient was advised to call back or seek an in-person evaluation if the symptoms worsen or if the condition fails to improve as anticipated.

## 2021-12-29 ENCOUNTER — Ambulatory Visit: Payer: Medicare Other

## 2022-01-03 ENCOUNTER — Ambulatory Visit: Payer: Medicaid Other | Admitting: Physician Assistant

## 2022-01-11 ENCOUNTER — Other Ambulatory Visit: Payer: Self-pay

## 2022-01-11 ENCOUNTER — Ambulatory Visit (INDEPENDENT_AMBULATORY_CARE_PROVIDER_SITE_OTHER): Payer: Self-pay | Admitting: Nurse Practitioner

## 2022-01-11 ENCOUNTER — Encounter: Payer: Self-pay | Admitting: Nurse Practitioner

## 2022-01-11 VITALS — BP 90/62 | HR 61 | Ht 65.0 in | Wt 149.0 lb

## 2022-01-11 DIAGNOSIS — Z1211 Encounter for screening for malignant neoplasm of colon: Secondary | ICD-10-CM

## 2022-01-11 DIAGNOSIS — Z716 Tobacco abuse counseling: Secondary | ICD-10-CM

## 2022-01-11 DIAGNOSIS — R0602 Shortness of breath: Secondary | ICD-10-CM

## 2022-01-11 MED ORDER — ALBUTEROL SULFATE HFA 108 (90 BASE) MCG/ACT IN AERS: 2.0000 | INHALATION_SPRAY | Freq: Four times a day (QID) | RESPIRATORY_TRACT | 1 refills | Status: AC | PRN

## 2022-01-11 NOTE — Patient Instructions (Signed)

## 2022-01-11 NOTE — Progress Notes (Signed)
? ?  Whitney Powell     MRN: [id number]      DOB: 1966-02-14 ? ? ?HPI ?Whitney Powell with medical history of GERD tobacco abuse, anxiety and depression is here for follow up for smoking cessation ?Marland Kitchen She has cut down to 6 cigarettes a day from about 12 cigarettes daily since she started taking wellbutrin 150mg  BID 4 weeks ago.reports that she is tolerating med well, denise adverse reactions to current medications.  ? ?Patient is due for colonoscopy, will send in a referral for colonoscopy, has an upcoming appointment with OB/GYN for Pap.  Due for shingles vaccine and Tdap vaccine, encouraged to get both vaccines at her pharmacy ? ? ?ROS ?Denies recent fever or chills. ?Denies sinus pressure, nasal congestion, ear pain or sore throat. ?Denies chest congestion, productive cough or wheezing. ?Denies chest pains, palpitations and leg swelling ?Denies abdominal pain, nausea, vomiting,diarrhea or constipation.   ?Denies dysuria, frequency, hesitancy or incontinence. ?Denies joint pain, swelling and limitation in mobility. ?Denies headaches, seizures, numbness, or tingling. ? ? ?PE ? ?BP 90/62 (BP Location: Left Arm, Cuff Size: Normal)   Pulse 61   Ht 5\' 5"  (1.651 m)   Wt 149 lb (67.6 kg)   SpO2 99%   BMI 24.79 kg/m?  ? ?Patient alert and oriented and in no cardiopulmonary distress. ? ?Chest: Clear to auscultation bilaterally. ? ?CVS: S1, S2 no murmurs, no S3.Regular rate. ? ?ABD: Soft non tender.  ? ?Ext: No edema ? ?MS: Adequate ROM spine, shoulders, hips and knees. ? ?Skin: Intact, no ulcerations or rash noted. ? ?Psych: Good eye contact, normal affect. Memory intact not anxious or depressed appearing. ? ? ? ? ?Assessment & Plan. ?Encounter for tobacco use cessation counseling ?Pt congratulated on cutting back on smoking and encouraged to keep cutting back. Started taking Wellbutrin 150 mg twice daily 4 weeks ago, she has cut down smoking to 6 cigarettes daily.  Patient denies any adverse reaction to Wellbutrin ? ?SOB  (shortness of breath) ?Now resolved ?Denies shortness of breath, wheezing today. ?Patient has cut down on smoking from 12 cigarettes daily to 6 cigarettes daily. ?Refill albuterol inhaler use as needed for shortness of breath or wheezing.   ? ?

## 2022-01-11 NOTE — Assessment & Plan Note (Addendum)
Pt congratulated on cutting back on smoking and encouraged to keep cutting back. Started taking Wellbutrin 150 mg twice daily 4 weeks ago, she has cut down smoking to 6 cigarettes daily.  Patient denies any adverse reaction to Wellbutrin ?

## 2022-01-11 NOTE — Assessment & Plan Note (Signed)
Now resolved ?Denies shortness of breath, wheezing today. ?Patient has cut down on smoking from 12 cigarettes daily to 6 cigarettes daily. ?Refill albuterol inhaler use as needed for shortness of breath or wheezing.  ?

## 2022-01-12 ENCOUNTER — Encounter: Payer: Medicare Other | Admitting: Adult Health

## 2022-01-13 ENCOUNTER — Encounter: Payer: Self-pay | Admitting: Internal Medicine

## 2022-01-20 DIAGNOSIS — F329 Major depressive disorder, single episode, unspecified: Secondary | ICD-10-CM | POA: Diagnosis not present

## 2022-03-03 ENCOUNTER — Encounter: Payer: Self-pay | Admitting: *Deleted

## 2022-03-03 ENCOUNTER — Ambulatory Visit (INDEPENDENT_AMBULATORY_CARE_PROVIDER_SITE_OTHER): Payer: Self-pay | Admitting: *Deleted

## 2022-03-03 VITALS — Ht 65.0 in | Wt 160.0 lb

## 2022-03-03 DIAGNOSIS — Z1211 Encounter for screening for malignant neoplasm of colon: Secondary | ICD-10-CM

## 2022-03-03 MED ORDER — PEG 3350-KCL-NA BICARB-NACL 420 G PO SOLR: 4000.0000 mL | Freq: Once | ORAL | 0 refills | Status: AC

## 2022-03-03 NOTE — Progress Notes (Signed)
Spoke to pt.  Scheduled procedure for 03/09/2022 at 1:30, arrival 12:00 at [facility].  Reviewed prep instructions with pt by phone.  Pt aware to pick up prep kit and instructions at her pharmacy.  Faxed instructions to [facility].  Pt is aware that she needs an enema and dulcolax. ?

## 2022-03-03 NOTE — Progress Notes (Signed)
Pt informed to let me know if she does not receive her prep instructions from pharmacy.  She was made aware that she could pick them up from our office if needed. ?

## 2022-03-03 NOTE — Progress Notes (Signed)
Okay to schedule. ASA 2 

## 2022-03-03 NOTE — Progress Notes (Addendum)
Gastroenterology Pre-Procedure Review ? ?Request Date: 03/03/2022 ?Requesting Physician: Vena Rua, FNP at [facility], no previous TCS ? ?PATIENT REVIEW QUESTIONS: The patient responded to the following health history questions as indicated:   ? ?1. Diabetes Melitis: no ?2. Joint replacements in the past 12 months: no ?3. Major health problems in the past 3 months: no ?4. Has an artificial valve or MVP: no ?5. Has a defibrillator: no ?6. Has been advised in past to take antibiotics in advance of a procedure like teeth cleaning: no ?7. Family history of colon cancer: no  ?8. Alcohol Use: no ?9. Illicit drug Use: no ?10. History of sleep apnea: no  ?11. History of coronary artery or other vascular stents placed within the last 12 months: no ?12. History of any prior anesthesia complications: no ?13. Body mass index is 26.63 kg/m?. ?   ?MEDICATIONS & ALLERGIES:    ?Patient reports the following regarding taking any blood thinners:   ?Plavix? no ?Aspirin? no ?Coumadin? no ?Brilinta? no ?Xarelto? no ?Eliquis? no ?Pradaxa? no ?Savaysa? no ?Effient? no ? ?Patient confirms/reports the following medications:  ?Current Outpatient Medications  ?Medication Sig Dispense Refill  ? albuterol (VENTOLIN HFA) 108 (90 Base) MCG/ACT inhaler Inhale 2 puffs into the lungs every 6 (six) hours as needed for wheezing or shortness of breath. 8 g 1  ? buPROPion (ZYBAN) 150 MG 12 hr tablet Take 150 mg by mouth daily at 6 (six) AM.    ? citalopram (CELEXA) 20 MG tablet Take 20 mg by mouth daily.    ? ibuprofen (ADVIL) 200 MG tablet Take 200 mg by mouth as needed. Takes 2 tablets as needed.    ? traZODone (DESYREL) 100 MG tablet Take 100 mg by mouth at bedtime.    ? ?No current facility-administered medications for this visit.  ? ? ?Patient confirms/reports the following allergies:  ?No Known Allergies ? ?No orders of the defined types were placed in this encounter. ? ? ?AUTHORIZATION INFORMATION ?Primary Insurance: [employer],  [place] #: [id number],  Group #: [id number] ?Pre-Cert / Josem Kaufmann required: No, not required ? ?Secondary Insurance: Medicaid,  ID #: [id number] ?Pre-Cert / Josem Kaufmann required: No, not required ? ?SCHEDULE INFORMATION: ?Procedure has been scheduled as follows:  ?Date: 03/09/2022, Time: 1:30 ?Location: [facility] with Dr. Gala Romney ? ?This Gastroenterology Pre-Precedure Review Form is being routed to the following provider(s): Aliene Altes, PA-C ?  ?

## 2022-03-03 NOTE — Addendum Note (Signed)
Addended by: Metro Kung on: 03/03/2022 10:26 AM ? ? Modules accepted: Orders ? ?

## 2022-03-04 ENCOUNTER — Telehealth: Payer: Self-pay | Admitting: Internal Medicine

## 2022-03-04 NOTE — Telephone Encounter (Signed)
I had a message on my VM that patient wanted to cancel her procedure on Wednesday. [phone] ?

## 2022-03-04 NOTE — Telephone Encounter (Signed)
Tried to call pt back to reschedule procedure.  Tried multiple times but keep getting message that my call can not be completed as dialed.  Left message for Whitney Powell in Endo to cancel procedure. ?

## 2022-03-07 ENCOUNTER — Encounter: Payer: Self-pay | Admitting: *Deleted

## 2022-03-07 NOTE — Telephone Encounter (Signed)
Tried to call pt again.  Left a voice mail for pt to call me back.  Mailed a letter out for pt to contact our office if she would like to reschedule. ?

## 2022-03-09 ENCOUNTER — Ambulatory Visit (HOSPITAL_COMMUNITY): Admit: 2022-03-09 | Payer: Medicare Other | Admitting: Internal Medicine

## 2022-03-09 ENCOUNTER — Encounter (HOSPITAL_COMMUNITY): Payer: Self-pay

## 2022-03-09 SURGERY — COLONOSCOPY WITH PROPOFOL
Anesthesia: Monitor Anesthesia Care

## 2022-03-14 NOTE — Telephone Encounter (Signed)
error 

## 2022-04-01 ENCOUNTER — Other Ambulatory Visit: Payer: Self-pay | Admitting: Nurse Practitioner

## 2022-04-01 DIAGNOSIS — Z720 Tobacco use: Secondary | ICD-10-CM

## 2022-04-01 NOTE — Telephone Encounter (Signed)
Please advise 

## 2022-04-14 ENCOUNTER — Ambulatory Visit: Payer: Medicare Other | Admitting: Nurse Practitioner

## 2022-07-26 NOTE — Progress Notes (Signed)
Patient left without being seen or examined. 

## 2022-09-08 DIAGNOSIS — F329 Major depressive disorder, single episode, unspecified: Secondary | ICD-10-CM | POA: Diagnosis not present

## 2022-12-29 ENCOUNTER — Encounter: Payer: Self-pay | Admitting: Radiology

## 2023-01-05 DIAGNOSIS — F329 Major depressive disorder, single episode, unspecified: Secondary | ICD-10-CM | POA: Diagnosis not present

## 2023-03-30 DIAGNOSIS — F329 Major depressive disorder, single episode, unspecified: Secondary | ICD-10-CM | POA: Diagnosis not present

## 2023-06-28 DIAGNOSIS — F331 Major depressive disorder, recurrent, moderate: Secondary | ICD-10-CM | POA: Diagnosis not present

## 2023-06-28 DIAGNOSIS — Z1331 Encounter for screening for depression: Secondary | ICD-10-CM | POA: Diagnosis not present

## 2023-06-28 DIAGNOSIS — Z595 Extreme poverty: Secondary | ICD-10-CM | POA: Diagnosis not present

## 2023-06-28 DIAGNOSIS — Z6828 Body mass index (BMI) 28.0-28.9, adult: Secondary | ICD-10-CM | POA: Diagnosis not present

## 2023-08-23 DIAGNOSIS — Z6829 Body mass index (BMI) 29.0-29.9, adult: Secondary | ICD-10-CM | POA: Diagnosis not present

## 2023-08-23 DIAGNOSIS — F331 Major depressive disorder, recurrent, moderate: Secondary | ICD-10-CM | POA: Diagnosis not present

## 2023-08-23 DIAGNOSIS — F17200 Nicotine dependence, unspecified, uncomplicated: Secondary | ICD-10-CM | POA: Diagnosis not present

## 2023-08-23 DIAGNOSIS — Z1331 Encounter for screening for depression: Secondary | ICD-10-CM | POA: Diagnosis not present

## 2023-10-18 DIAGNOSIS — F17200 Nicotine dependence, unspecified, uncomplicated: Secondary | ICD-10-CM | POA: Diagnosis not present

## 2023-10-18 DIAGNOSIS — Z1331 Encounter for screening for depression: Secondary | ICD-10-CM | POA: Diagnosis not present

## 2023-10-18 DIAGNOSIS — F331 Major depressive disorder, recurrent, moderate: Secondary | ICD-10-CM | POA: Diagnosis not present

## 2023-10-18 DIAGNOSIS — Z6830 Body mass index (BMI) 30.0-30.9, adult: Secondary | ICD-10-CM | POA: Diagnosis not present

## 2024-01-31 DIAGNOSIS — F331 Major depressive disorder, recurrent, moderate: Secondary | ICD-10-CM | POA: Diagnosis not present

## 2024-01-31 DIAGNOSIS — Z1331 Encounter for screening for depression: Secondary | ICD-10-CM | POA: Diagnosis not present

## 2024-01-31 DIAGNOSIS — Z6824 Body mass index (BMI) 24.0-24.9, adult: Secondary | ICD-10-CM | POA: Diagnosis not present

## 2024-01-31 DIAGNOSIS — F17200 Nicotine dependence, unspecified, uncomplicated: Secondary | ICD-10-CM | POA: Diagnosis not present

## 2024-06-21 ENCOUNTER — Encounter: Payer: Self-pay | Admitting: Radiology

## 2024-09-02 ENCOUNTER — Encounter: Payer: Self-pay | Admitting: Radiology
